# Patient Record
Sex: Female | Born: 1985 | Race: White | Hispanic: No | Marital: Married | Smoking: Former smoker
Health system: Southern US, Community
[De-identification: ages and names within clinical notes are randomized; demographics above are authoritative.]

## PROBLEM LIST (undated history)

## (undated) ENCOUNTER — Inpatient Hospital Stay: Payer: Self-pay

## (undated) DIAGNOSIS — F32A Depression, unspecified: Secondary | ICD-10-CM

## (undated) DIAGNOSIS — F419 Anxiety disorder, unspecified: Secondary | ICD-10-CM

## (undated) DIAGNOSIS — R87629 Unspecified abnormal cytological findings in specimens from vagina: Secondary | ICD-10-CM

## (undated) HISTORY — PX: TOOTH EXTRACTION: SUR596

## (undated) HISTORY — DX: Depression, unspecified: F32.A

## (undated) HISTORY — DX: Anxiety disorder, unspecified: F41.9

---
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
## 2007-08-04 ENCOUNTER — Emergency Department: Payer: Self-pay | Admitting: Emergency Medicine

## 2008-08-12 ENCOUNTER — Encounter: Payer: Self-pay | Admitting: Maternal & Fetal Medicine

## 2008-09-29 ENCOUNTER — Inpatient Hospital Stay: Payer: Self-pay | Admitting: Obstetrics and Gynecology

## 2010-06-06 ENCOUNTER — Emergency Department: Payer: Self-pay | Admitting: Emergency Medicine

## 2011-08-28 ENCOUNTER — Emergency Department: Payer: Self-pay | Admitting: Emergency Medicine

## 2011-09-28 ENCOUNTER — Emergency Department: Payer: Self-pay | Admitting: *Deleted

## 2011-09-28 LAB — URINALYSIS, COMPLETE
Bacteria: NONE SEEN
Bilirubin,UR: NEGATIVE
Blood: NEGATIVE
Nitrite: NEGATIVE
Protein: NEGATIVE
RBC,UR: NONE SEEN /HPF (ref 0–5)
Specific Gravity: 1.009 (ref 1.003–1.030)
Squamous Epithelial: 2
WBC UR: 5 /HPF (ref 0–5)

## 2011-09-29 LAB — PREGNANCY, URINE: Pregnancy Test, Urine: NEGATIVE m[IU]/mL

## 2011-11-03 ENCOUNTER — Emergency Department: Payer: Self-pay | Admitting: Emergency Medicine

## 2011-11-03 LAB — URINALYSIS, COMPLETE
Bilirubin,UR: NEGATIVE
Glucose,UR: NEGATIVE mg/dL (ref 0–75)
Ketone: NEGATIVE
Nitrite: NEGATIVE
Ph: 7 (ref 4.5–8.0)
Protein: 100
RBC,UR: 39 /HPF (ref 0–5)
Squamous Epithelial: 7
Transitional Epi: 1
WBC UR: 183 /HPF (ref 0–5)

## 2011-11-03 LAB — BASIC METABOLIC PANEL
Anion Gap: 9 (ref 7–16)
BUN: 14 mg/dL (ref 7–18)
Chloride: 111 mmol/L — ABNORMAL HIGH (ref 98–107)
EGFR (African American): >60
Glucose: 106 mg/dL — ABNORMAL HIGH (ref 65–99)
Osmolality: 288 (ref 275–301)
Potassium: 4.2 mmol/L (ref 3.5–5.1)
Sodium: 144 mmol/L (ref 136–145)

## 2011-11-03 LAB — CBC
MCH: 30.2 pg (ref 26.0–34.0)
MCHC: 34.4 g/dL (ref 32.0–36.0)
MCV: 88 fL (ref 80–100)
Platelet: 157 10*3/uL (ref 150–440)
RBC: 4.35 10*6/uL (ref 3.80–5.20)
WBC: 8.3 10*3/uL (ref 3.6–11.0)

## 2011-11-03 LAB — PREGNANCY, URINE: Pregnancy Test, Urine: NEGATIVE m[IU]/mL

## 2011-11-03 LAB — WET PREP, GENITAL

## 2011-12-08 ENCOUNTER — Emergency Department: Payer: Self-pay | Admitting: *Deleted

## 2011-12-08 LAB — CBC WITH DIFFERENTIAL/PLATELET
Basophil #: 0 10*3/uL (ref 0.0–0.1)
Eosinophil #: 0 10*3/uL (ref 0.0–0.7)
Eosinophil %: 0.1 %
HCT: 36.5 % (ref 35.0–47.0)
MCH: 30.4 pg (ref 26.0–34.0)
MCHC: 35.3 g/dL (ref 32.0–36.0)
MCV: 86 fL (ref 80–100)
Monocyte #: 1.4 x10 3/mm — ABNORMAL HIGH (ref 0.2–0.9)
Neutrophil #: 12.9 10*3/uL — ABNORMAL HIGH (ref 1.4–6.5)
Neutrophil %: 83.2 %
Platelet: 134 10*3/uL — ABNORMAL LOW (ref 150–440)
RDW: 12.9 % (ref 11.5–14.5)

## 2011-12-08 LAB — URINALYSIS, COMPLETE
Blood: NEGATIVE
Glucose,UR: NEGATIVE mg/dL (ref 0–75)
Ketone: NEGATIVE
Nitrite: POSITIVE
Ph: 7 (ref 4.5–8.0)
Protein: 30

## 2011-12-08 LAB — BASIC METABOLIC PANEL
BUN: 9 mg/dL (ref 7–18)
Co2: 27 mmol/L (ref 21–32)
EGFR (Non-African Amer.): >60
Glucose: 107 mg/dL — ABNORMAL HIGH (ref 65–99)
Potassium: 3 mmol/L — ABNORMAL LOW (ref 3.5–5.1)
Sodium: 139 mmol/L (ref 136–145)

## 2011-12-08 LAB — PREGNANCY, URINE: Pregnancy Test, Urine: NEGATIVE m[IU]/mL

## 2012-02-13 ENCOUNTER — Emergency Department: Payer: Self-pay | Admitting: Emergency Medicine

## 2012-02-13 LAB — CBC
HCT: 40.7 % (ref 35.0–47.0)
HGB: 13.7 g/dL (ref 12.0–16.0)
MCHC: 33.6 g/dL (ref 32.0–36.0)
Platelet: 176 10*3/uL (ref 150–440)
WBC: 15.6 10*3/uL — ABNORMAL HIGH (ref 3.6–11.0)

## 2012-02-13 LAB — URINALYSIS, COMPLETE
Ketone: NEGATIVE
Nitrite: NEGATIVE
Protein: 30
Specific Gravity: 1.018 (ref 1.003–1.030)
Squamous Epithelial: 8
WBC UR: 19 /HPF (ref 0–5)

## 2012-02-13 LAB — COMPREHENSIVE METABOLIC PANEL
Albumin: 4.6 g/dL (ref 3.4–5.0)
Alkaline Phosphatase: 68 U/L (ref 50–136)
Anion Gap: 8 (ref 7–16)
BUN: 8 mg/dL (ref 7–18)
Calcium, Total: 9.4 mg/dL (ref 8.5–10.1)
Creatinine: 0.75 mg/dL (ref 0.60–1.30)
Glucose: 91 mg/dL (ref 65–99)
Osmolality: 270 (ref 275–301)
Potassium: 4 mmol/L (ref 3.5–5.1)
SGOT(AST): 21 U/L (ref 15–37)
Sodium: 136 mmol/L (ref 136–145)
Total Protein: 8.7 g/dL — ABNORMAL HIGH (ref 6.4–8.2)

## 2012-02-13 LAB — LIPASE, BLOOD: Lipase: 98 U/L (ref 73–393)

## 2012-02-13 LAB — PREGNANCY, URINE: Pregnancy Test, Urine: NEGATIVE m[IU]/mL

## 2012-04-04 ENCOUNTER — Inpatient Hospital Stay: Payer: Self-pay | Admitting: Psychiatry

## 2012-04-04 LAB — URINALYSIS, COMPLETE
Bilirubin,UR: NEGATIVE
Blood: NEGATIVE
Ph: 7 (ref 4.5–8.0)
Protein: NEGATIVE
RBC,UR: 1 /HPF (ref 0–5)
Squamous Epithelial: 19

## 2012-04-04 LAB — DRUG SCREEN, URINE
Barbiturates, Ur Screen: NEGATIVE (ref ?–200)
Benzodiazepine, Ur Scrn: NEGATIVE (ref ?–200)
Cocaine Metabolite,Ur ~~LOC~~: NEGATIVE (ref ?–300)
Methadone, Ur Screen: NEGATIVE (ref ?–300)
Opiate, Ur Screen: NEGATIVE (ref ?–300)
Phencyclidine (PCP) Ur S: NEGATIVE (ref ?–25)
Tricyclic, Ur Screen: NEGATIVE (ref ?–1000)

## 2012-04-04 LAB — ETHANOL
Ethanol %: <0.003 % (ref 0.000–0.080)
Ethanol: <3 mg/dL

## 2012-04-04 LAB — CBC
HCT: 37.8 % (ref 35.0–47.0)
HGB: 13.1 g/dL (ref 12.0–16.0)
MCH: 31 pg (ref 26.0–34.0)
MCHC: 34.8 g/dL (ref 32.0–36.0)
RBC: 4.25 10*6/uL (ref 3.80–5.20)

## 2012-04-04 LAB — ACETAMINOPHEN LEVEL: Acetaminophen: <2 ug/mL

## 2012-04-04 LAB — COMPREHENSIVE METABOLIC PANEL
Albumin: 4.2 g/dL (ref 3.4–5.0)
Anion Gap: 6 — ABNORMAL LOW (ref 7–16)
Calcium, Total: 8.9 mg/dL (ref 8.5–10.1)
Creatinine: 0.66 mg/dL (ref 0.60–1.30)
Glucose: 90 mg/dL (ref 65–99)
Potassium: 4.4 mmol/L (ref 3.5–5.1)
SGOT(AST): 18 U/L (ref 15–37)

## 2012-04-04 LAB — SALICYLATE LEVEL: Salicylates, Serum: <1.7 mg/dL

## 2012-04-04 LAB — TSH: Thyroid Stimulating Horm: 0.67 u[IU]/mL

## 2012-04-04 LAB — PREGNANCY, URINE: Pregnancy Test, Urine: NEGATIVE m[IU]/mL

## 2012-08-16 ENCOUNTER — Emergency Department: Payer: Self-pay | Admitting: Unknown Physician Specialty

## 2012-08-16 LAB — CBC WITH DIFFERENTIAL/PLATELET
Basophil #: 0 10*3/uL (ref 0.0–0.1)
Eosinophil %: 0.1 %
Lymphocyte #: 0.8 10*3/uL — ABNORMAL LOW (ref 1.0–3.6)
Lymphocyte %: 5.4 %
MCH: 30.4 pg (ref 26.0–34.0)
MCV: 88 fL (ref 80–100)
Monocyte #: 1 x10 3/mm — ABNORMAL HIGH (ref 0.2–0.9)
Monocyte %: 6.8 %
Neutrophil #: 13.3 10*3/uL — ABNORMAL HIGH (ref 1.4–6.5)
Neutrophil %: 87.6 %
RBC: 4.47 10*6/uL (ref 3.80–5.20)
RDW: 12.4 % (ref 11.5–14.5)
WBC: 15.2 10*3/uL — ABNORMAL HIGH (ref 3.6–11.0)

## 2012-08-16 LAB — COMPREHENSIVE METABOLIC PANEL
Alkaline Phosphatase: 57 U/L (ref 50–136)
Anion Gap: 6 — ABNORMAL LOW (ref 7–16)
BUN: 13 mg/dL (ref 7–18)
Calcium, Total: 8.8 mg/dL (ref 8.5–10.1)
Chloride: 106 mmol/L (ref 98–107)
Creatinine: 0.93 mg/dL (ref 0.60–1.30)
EGFR (African American): >60
EGFR (Non-African Amer.): >60
SGOT(AST): 15 U/L (ref 15–37)
SGPT (ALT): 13 U/L (ref 12–78)
Total Protein: 7.3 g/dL (ref 6.4–8.2)

## 2012-08-16 LAB — LIPASE, BLOOD: Lipase: 114 U/L (ref 73–393)

## 2012-08-16 LAB — RAPID INFLUENZA A&B ANTIGENS

## 2012-10-07 IMAGING — CT CT ABD-PELV W/ CM
1 of 2 series · 14 of 32 positions shown, 18 images · non-contrast
Comparison: none

REASON FOR EXAM: (1) FEVER, NAUSEA; (2) RLQ PAIN, EVAL APPENDIX;    NOTE:
Nursing to Give Oral CT
COMMENTS:   May transport without cardiac monitor

[Series 2: 3mm soft tissue · axial · 0.66mm/px · z∈[-957,-540]mm · 14 of 153 slices shown, 18 images]
[im 7/153  soft-tissue]
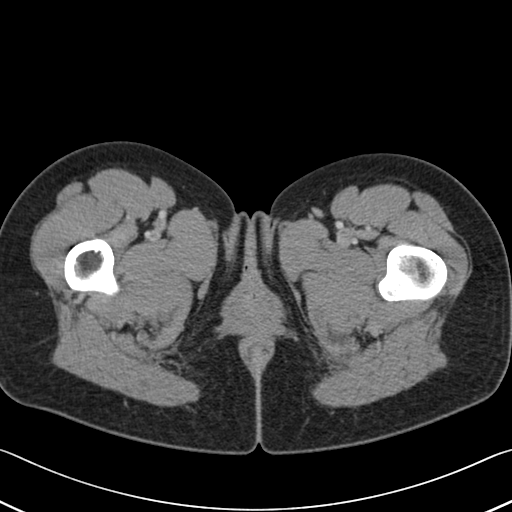
[im 7/153  bone]
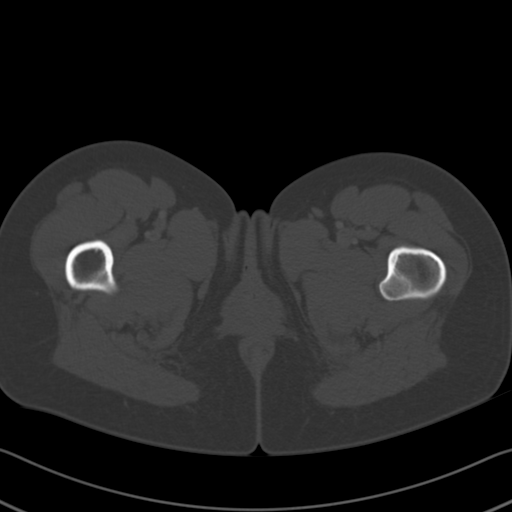
[im 20/153  soft-tissue]
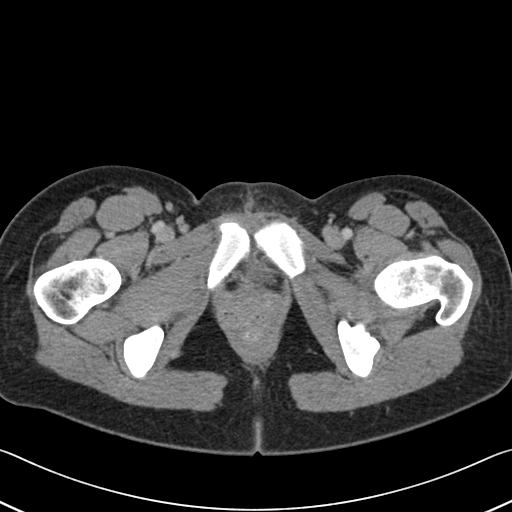
[im 32/153  soft-tissue]
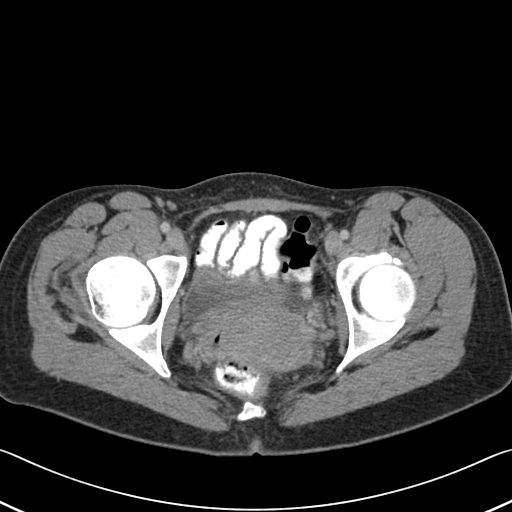
[im 45/153  soft-tissue]
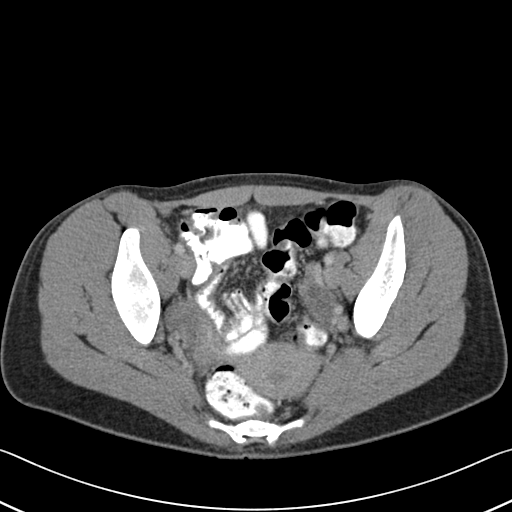
[im 58/153  soft-tissue]
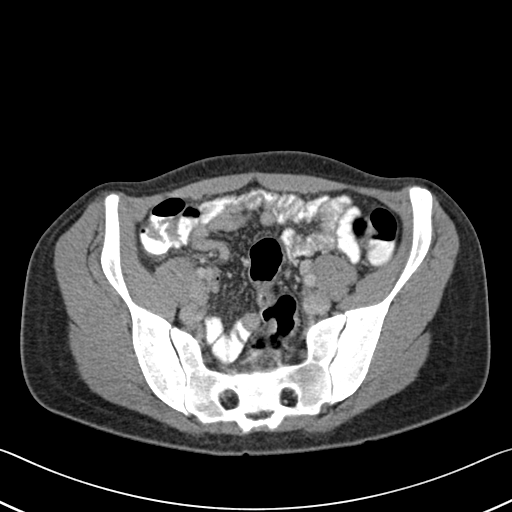
[im 70/153  soft-tissue]
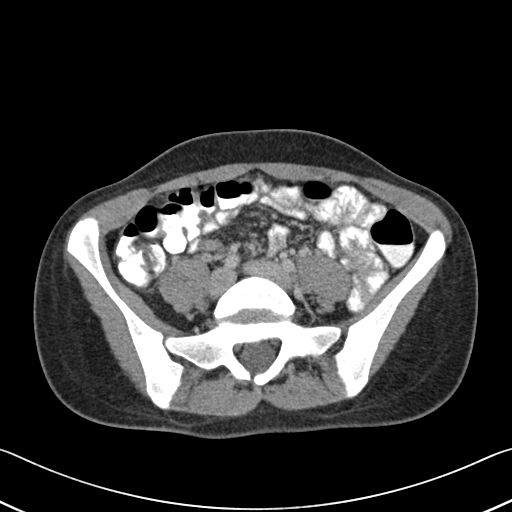
[im 83/153  soft-tissue]
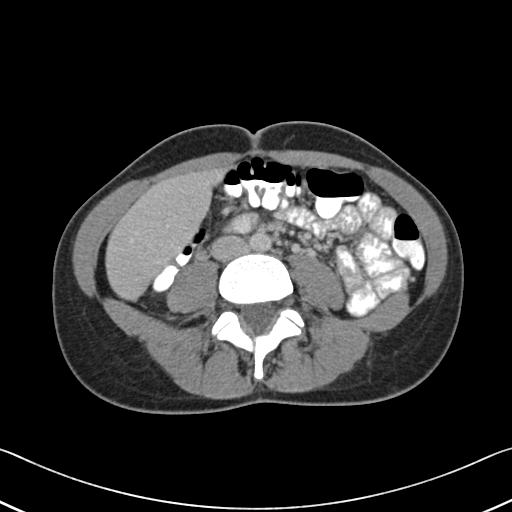
[im 96/153  soft-tissue]
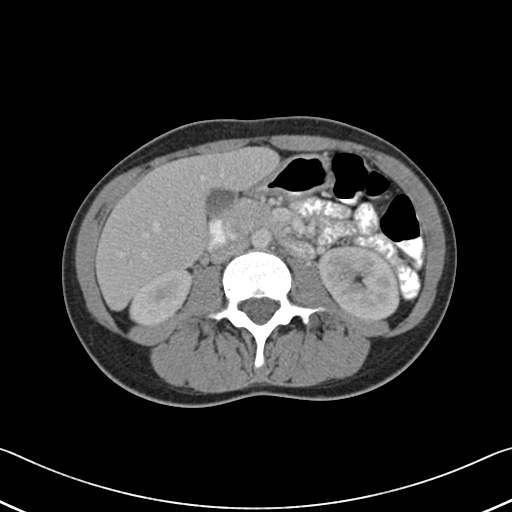
[im 108/153  soft-tissue]
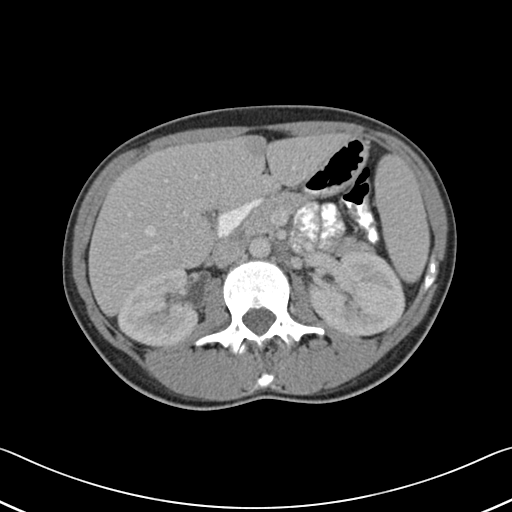
[im 108/153  bone]
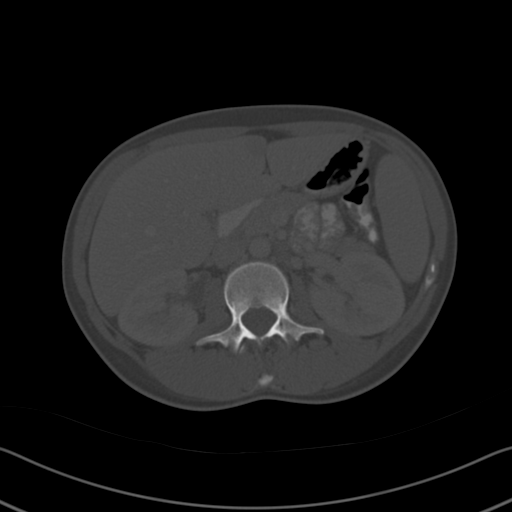
[im 121/153  soft-tissue]
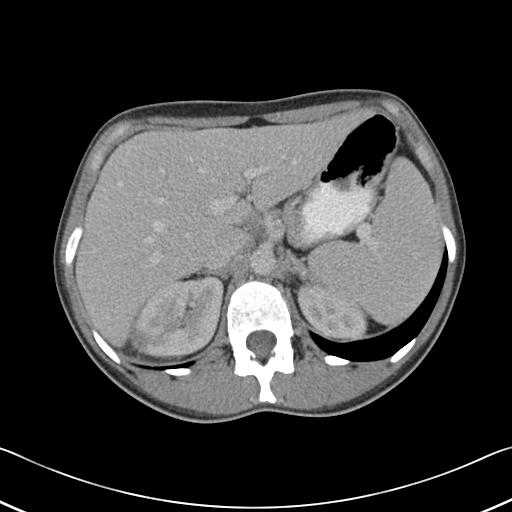
[im 127/153  lung]
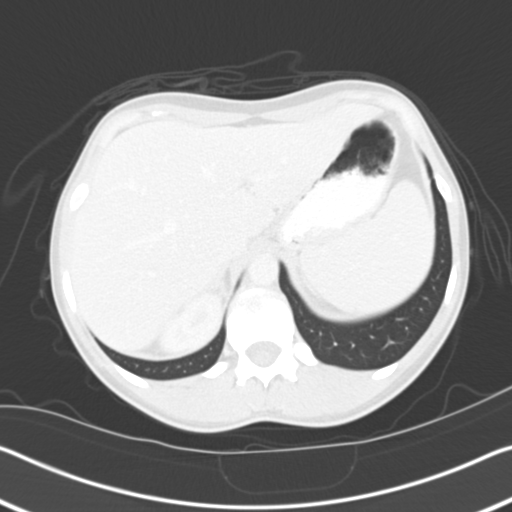
[im 134/153  soft-tissue]
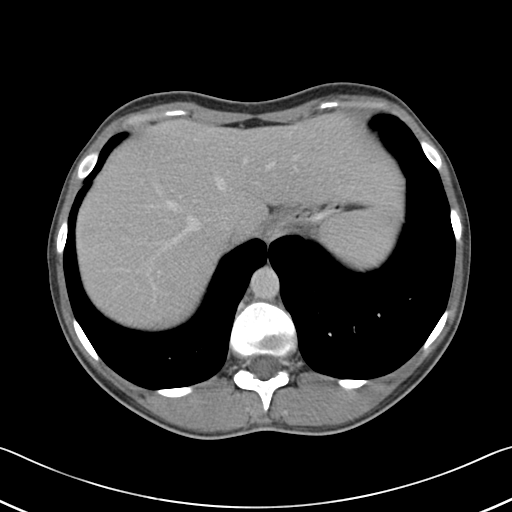
[im 134/153  lung]
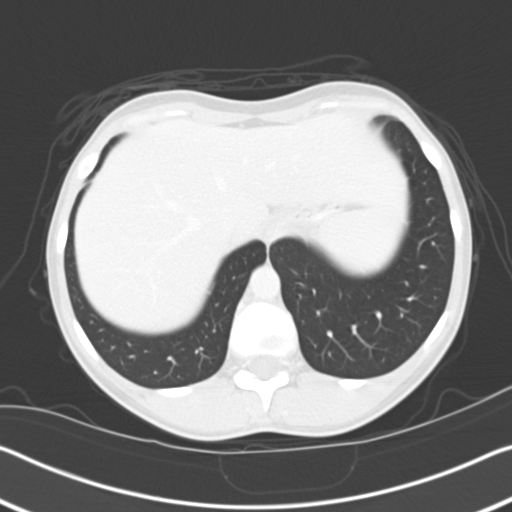
[im 140/153  lung]
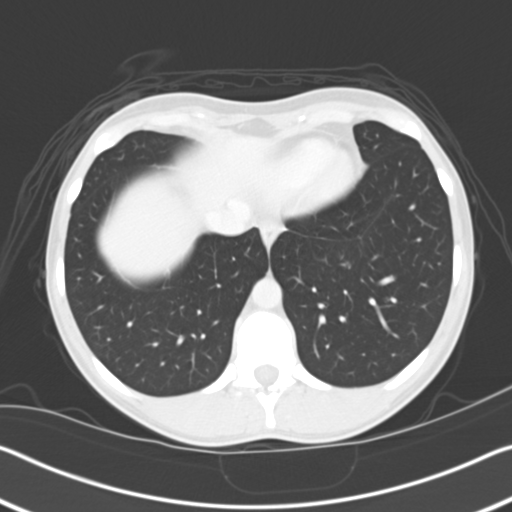
[im 146/153  soft-tissue]
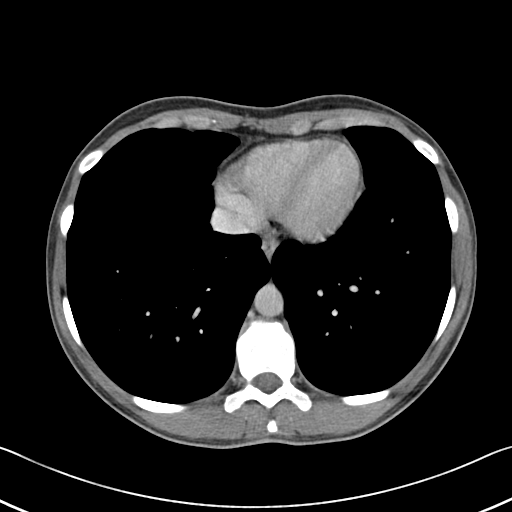
[im 146/153  lung]
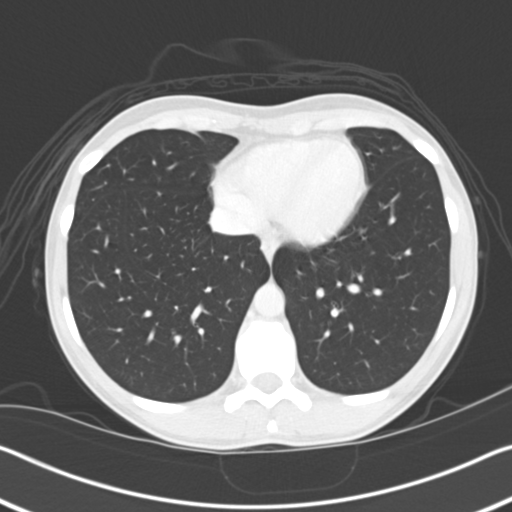

[14 of 32 positions shown; findings below may reference images not displayed]

PROCEDURE:     CT  - CT ABDOMEN / PELVIS  W  - December 08, 2011 [DATE]

RESULT:     Axial CT scanning was performed through the abdomen and pelvis
with reconstructions at 3 mm intervals and slice thicknesses. Review of
multiplanar reconstructed images was performed separately on the VIA
monitor. The patient received 75 cc of Dsovue-LPP and also received oral
contrast material.

In the right lower quadrant of the abdomen is no evidence of an inflammatory
process. A normal calibered partially contrast and gas filled appendix is
demonstrated. There is no evidence of an umbilical nor inguinal hernia.
There is no small or large bowel obstruction. Is soft tissue fullness in the
right adnexal region which likely reflects a cystic ovarian process. Similar
but smaller findings are noted on the left. The uterus is retroverted
exhibits no acute abnormality. The partially distended urinary bladder is
normal in appearance.

The liver exhibits no focal mass or ductal dilation. The gallbladder is
adequately distended with no evidence of calcified stones. The spleen,
partially distended stomach, pancreas, and adrenal glands are normal in
appearance. The caliber of the abdominal aorta is normal. In the lateral
aspect of the mid pole of the right kidney there is an 1.5 cm focus of
decreased density that is wedge-shaped and could reflect an infarct. It
exhibits Hounsfield measurement of +41 with the adjacent parenchyma
exhibiting Hounsfield measurement of +113. There is no hydronephrosis nor
calcified stones.

The lung bases are clear. The lumbar vertebral bodies are preserved in
height.
IMPRESSION: 1. There is no evidence of an acute bowel abnormality. A normal appearing
appendix is demonstrated.
2. There is soft tissue fullness in the adnexal regions most compatible with
cystic ovarian processes. Pelvic ultrasound in October 2011 demonstrated
complex cystic structures associated with the right ovary.
3. There is a wedge-shaped hypodensity measuring 1.5 cm in greatest
dimension in the lateral aspect of the midpole cortex of the right kidney.
This is nonspecific but could reflect an infarct in the appropriate clinical
setting. Correlation with presence of any hematuria would be useful. There
is no evidence of urinary tract obstruction.
4. There is no evidence of acute hepatobiliary abnormality.

[REDACTED]

## 2012-10-16 ENCOUNTER — Emergency Department: Payer: Self-pay | Admitting: Emergency Medicine

## 2013-06-01 ENCOUNTER — Ambulatory Visit: Payer: Self-pay | Admitting: General Surgery

## 2013-06-25 ENCOUNTER — Encounter: Payer: Self-pay | Admitting: *Deleted

## 2013-10-27 ENCOUNTER — Ambulatory Visit (INDEPENDENT_AMBULATORY_CARE_PROVIDER_SITE_OTHER): Payer: Medicaid Other | Admitting: General Surgery

## 2013-10-27 ENCOUNTER — Other Ambulatory Visit: Payer: Medicaid Other

## 2013-10-27 ENCOUNTER — Encounter: Payer: Self-pay | Admitting: General Surgery

## 2013-10-27 VITALS — BP 120/66 | HR 76 | Resp 14 | Ht 67.0 in | Wt 131.0 lb

## 2013-10-27 DIAGNOSIS — N63 Unspecified lump in unspecified breast: Secondary | ICD-10-CM

## 2013-10-27 NOTE — Progress Notes (Signed)
Patient ID: Margaret [name], female   DOB: March 14, 1986, [age] y.o.   MRN: [id number]  Chief Complaint  Patient presents with  . Other    right breast mass    HPI Margaret [name] is a 28 y.o. female here today for an evaluation of right breast lump and nipple discharge. Patient noticed a knot in the right breast for about 2 months. She states it will get larger but then it reduces in size. This seems to correlate with her menstrual cycle, the most prominent just prior to the menses. She is presently midcycle. Tenderness in right breast, denies breast trauma.  States she still has some milk production from both breast since her last child 5 years ago (she did not breast feed). She notices this only with manipulation.  No family history of breast cancer.  HPI  No past medical history on file.  No past surgical history on file.  No family history on file.  Social History History  Substance Use Topics  . Smoking status: Current Every Day Smoker -- 1.00 packs/day for 10 years    Types: Cigarettes  . Smokeless tobacco: Never Used  . Alcohol Use: No    No Known Allergies  No current outpatient prescriptions on file.   No current facility-administered medications for this visit.    Review of Systems Review of Systems  Constitutional: Negative.   Respiratory: Negative.   Cardiovascular: Negative.     Blood pressure 120/66, pulse 76, resp. rate 14, height 5\' 7"  (1.702 m), weight 131 lb (59.421 kg), last menstrual period 10/13/2013.  Physical Exam Physical Exam  Constitutional: She is oriented to person, place, and time. She appears well-developed and well-nourished.  Neck: Neck supple.  Cardiovascular: Normal rate, regular rhythm and normal heart sounds.   Pulmonary/Chest: Effort normal and breath sounds normal. Right breast exhibits mass. Right breast exhibits no inverted nipple, no nipple discharge, no skin change and no tenderness. Left breast exhibits no inverted nipple, no mass,  no nipple discharge, no skin change and no tenderness.    Soft fullness at 12 o'clock right breast   Lymphadenopathy:    She has no cervical adenopathy.    She has no axillary adenopathy.  Small 5 mm normal lymph nodes are noted in the supraclavicular area and axillary areas bilaterally  Neurological: She is alert and oriented to person, place, and time.  Skin: Skin is warm and dry.    Data Reviewed Ultrasound examination of the right breast with special attention to the upper outer quadrant was completed. In the 11 o'clock position there is a irregular hypoechoic area measuring 0.4 x 1.15 x 1.5 cm. This correlates with the area of prominence on clinical exam, 6 cm from the nipple. No clearly identifiable cystic lesions are appreciated. Brief examination of the retroareolar area shows no ductal dilatation. BI-RAD-4.  Assessment    This focal asymmetry may be a cluster of cysts or less likely a fibroadenoma based on the clinical reports a variation during her menstrual cycle. It certainly does not represent a simple cyst.     Plan    Considering her young age and family responsibilities (2 boys age 145 and 57) I think it reasonable to proceed to a vacuum biopsy. Due to scheduling was not possible to complete this today, but this will be scheduled convenient date.  She was encouraged to consider smoking cessation.  The small amount of milky nipple drainage with manipulation, unchanged over years does not likely represent elevated serum prolactin  levels and can be observed.     Arrange for right breast biopsy.   PCP: Evelene CroonNiemeyer, Meindert   Ref: Lyndel PleasureErica Howard NP   Earline MayotteByrnett, Jeffrey W 10/27/2013, 8:47 PM

## 2013-10-27 NOTE — Patient Instructions (Signed)
Continue self breast exams. Call office for any new breast issues or concerns. 

## 2013-11-12 ENCOUNTER — Encounter: Payer: Self-pay | Admitting: General Surgery

## 2013-11-12 ENCOUNTER — Other Ambulatory Visit: Payer: Medicaid Other

## 2013-11-12 ENCOUNTER — Ambulatory Visit (INDEPENDENT_AMBULATORY_CARE_PROVIDER_SITE_OTHER): Payer: Medicaid Other | Admitting: General Surgery

## 2013-11-12 VITALS — BP 96/56 | HR 70 | Resp 12 | Ht 67.0 in | Wt 133.0 lb

## 2013-11-12 DIAGNOSIS — N63 Unspecified lump in unspecified breast: Secondary | ICD-10-CM

## 2013-11-12 NOTE — Progress Notes (Signed)
Patient ID: Margaret BasemanHeather M [name], female   DOB: 08/06/1985, 28 y.o.   MRN: [id number]  Chief Complaint  Patient presents with  . Procedure    right breast biopsy    HPI Margaret BasemanHeather M [name] is a 28 y.o. female.  Here today for a right breast biopsy. HPI  No past medical history on file.  No past surgical history on file.  No family history on file.  Social History History  Substance Use Topics  . Smoking status: Former Smoker -- 1.00 packs/day for 10 years    Types: Cigarettes    Quit date: 11/08/2013  . Smokeless tobacco: Never Used  . Alcohol Use: No    No Known Allergies  No current outpatient prescriptions on file.   No current facility-administered medications for this visit.    Review of Systems Review of Systems  Constitutional: Negative.   Respiratory: Negative.   Cardiovascular: Negative.     Blood pressure 96/56, pulse 70, resp. rate 12, height 5\' 7"  (1.702 m), weight 133 lb (60.328 kg), last menstrual period 10/12/2013.  Physical Exam Physical Exam Focal thickening upper outer quadrant.  Data Reviewed Previous ultrasound showed a hypoechoic mass in the 11 o'clock position.  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     Plan    The patient was amenable to proceed with biopsy. After alcohol prep 10 cc of 0.5% Xylocaine with 0.25% Marcaine with 1-200,000 units of epinephrine was utilized well-tolerated. Chlor prep was applied to the skin. Using a 10-gauge Encor device set to a 12 mm sample depth the area was completely excised. Scant bleeding was noted. A postbiopsy clip was placed. Skin defect was closed with benzoin and Steri-Strips followed by Telfa Tegaderm dressing.  She was provided with written instructions in regards to wound care.  We'll plan for followup with nurse in one week.      PCP: Jake SharkNiemeyer, Meindert   Naji Mehringer W 11/12/2013, 8:38 PM

## 2013-11-12 NOTE — Patient Instructions (Signed)

## 2013-11-16 ENCOUNTER — Telehealth: Payer: Self-pay

## 2013-11-16 LAB — PATHOLOGY

## 2013-11-16 NOTE — Telephone Encounter (Signed)
Message left for patient to call back for biopsy results.

## 2013-11-17 NOTE — Telephone Encounter (Signed)
Notified patient that her biopsy was benign, patient pleased. Discussed follow-up appointment scheduled for 11/20/13, patient agrees.

## 2013-11-20 ENCOUNTER — Ambulatory Visit: Payer: Medicaid Other

## 2014-02-24 ENCOUNTER — Ambulatory Visit: Payer: Medicaid Other | Admitting: General Surgery

## 2014-03-15 ENCOUNTER — Encounter: Payer: Self-pay | Admitting: General Surgery

## 2014-05-19 ENCOUNTER — Ambulatory Visit: Payer: Self-pay | Admitting: General Surgery

## 2014-05-20 ENCOUNTER — Encounter: Payer: Self-pay | Admitting: *Deleted
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
## 2014-08-31 NOTE — Consult Note (Signed)
Brief Consult Note: Diagnosis: Major depressive episode.   Patient was seen by consultant.   Recommend further assessment or treatment.   Orders entered.   Comments: Ms. Given has been increasingly depressed in the past 3 months. She is suicidal; with a plan to hang herself.   PLAN: 1. Will admitt to BMU.   2. Start Prozac 20 mg fort depression and Ambien 10 mg for sleep.  Electronic Signatures: Kristine LineaPucilowska, Lakyra Tippins (MD)  (Signed [phone]-Nov-13 15:53)  Authored: Brief Consult Note   Last Updated: 22-Nov-13 15:53 by Kristine LineaPucilowska, Sarah Zerby (MD)

## 2014-12-06 ENCOUNTER — Encounter: Payer: Self-pay | Admitting: Emergency Medicine

## 2014-12-06 ENCOUNTER — Emergency Department: Payer: Self-pay

## 2014-12-06 ENCOUNTER — Emergency Department
Admission: EM | Admit: 2014-12-06 | Discharge: 2014-12-06 | Disposition: A | Discharge status: Home / Self Care | Payer: Self-pay | Attending: Emergency Medicine | Admitting: Emergency Medicine

## 2014-12-06 DIAGNOSIS — Z720 Tobacco use: Secondary | ICD-10-CM | POA: Insufficient documentation

## 2014-12-06 DIAGNOSIS — Y9389 Activity, other specified: Secondary | ICD-10-CM | POA: Insufficient documentation

## 2014-12-06 DIAGNOSIS — S40011A Contusion of right shoulder, initial encounter: Secondary | ICD-10-CM | POA: Insufficient documentation

## 2014-12-06 DIAGNOSIS — W1839XA Other fall on same level, initial encounter: Secondary | ICD-10-CM | POA: Insufficient documentation

## 2014-12-06 DIAGNOSIS — S46911A Strain of unspecified muscle, fascia and tendon at shoulder and upper arm level, right arm, initial encounter: Secondary | ICD-10-CM | POA: Insufficient documentation

## 2014-12-06 DIAGNOSIS — Y998 Other external cause status: Secondary | ICD-10-CM | POA: Insufficient documentation

## 2014-12-06 DIAGNOSIS — S40021A Contusion of right upper arm, initial encounter: Secondary | ICD-10-CM

## 2014-12-06 DIAGNOSIS — Y9289 Other specified places as the place of occurrence of the external cause: Secondary | ICD-10-CM | POA: Insufficient documentation

## 2014-12-06 DIAGNOSIS — IMO0001 Reserved for inherently not codable concepts without codable children: Secondary | ICD-10-CM

## 2014-12-06 MED ORDER — NAPROXEN 500 MG PO TABS: 500.0000 mg | ORAL_TABLET | Freq: Two times a day (BID) | ORAL | Status: DC

## 2014-12-06 NOTE — ED Notes (Signed)
Pt fell about two weeks ago and now having right shoulder pain.
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
## 2015-07-11 ENCOUNTER — Emergency Department
Admission: EM | Admit: 2015-07-11 | Discharge: 2015-07-11 | Disposition: A | Discharge status: Home / Self Care | Payer: Self-pay | Attending: Emergency Medicine | Admitting: Emergency Medicine

## 2015-07-11 ENCOUNTER — Encounter: Payer: Self-pay | Admitting: Emergency Medicine

## 2015-07-11 DIAGNOSIS — Z3202 Encounter for pregnancy test, result negative: Secondary | ICD-10-CM | POA: Insufficient documentation

## 2015-07-11 DIAGNOSIS — R1084 Generalized abdominal pain: Secondary | ICD-10-CM | POA: Insufficient documentation

## 2015-07-11 DIAGNOSIS — R112 Nausea with vomiting, unspecified: Secondary | ICD-10-CM | POA: Insufficient documentation

## 2015-07-11 DIAGNOSIS — F17210 Nicotine dependence, cigarettes, uncomplicated: Secondary | ICD-10-CM | POA: Insufficient documentation

## 2015-07-11 DIAGNOSIS — Z791 Long term (current) use of non-steroidal anti-inflammatories (NSAID): Secondary | ICD-10-CM | POA: Insufficient documentation

## 2015-07-11 DIAGNOSIS — R51 Headache: Secondary | ICD-10-CM | POA: Insufficient documentation

## 2015-07-11 DIAGNOSIS — R519 Headache, unspecified: Secondary | ICD-10-CM

## 2015-07-11 LAB — COMPREHENSIVE METABOLIC PANEL
ALBUMIN: 4.5 g/dL (ref 3.5–5.0)
ALT: 13 U/L — ABNORMAL LOW (ref 14–54)
ANION GAP: 7 (ref 5–15)
AST: 15 U/L (ref 15–41)
Alkaline Phosphatase: 32 U/L — ABNORMAL LOW (ref 38–126)
BILIRUBIN TOTAL: 0.6 mg/dL (ref 0.3–1.2)
BUN: 17 mg/dL (ref 6–20)
CHLORIDE: 112 mmol/L — AB (ref 101–111)
CO2: 22 mmol/L (ref 22–32)
CREATININE: 0.65 mg/dL (ref 0.44–1.00)
Calcium: 8.8 mg/dL — ABNORMAL LOW (ref 8.9–10.3)
GFR calc Af Amer: >60 mL/min (ref >60)
Glucose, Bld: 101 mg/dL — ABNORMAL HIGH (ref 65–99)
Potassium: 3.7 mmol/L (ref 3.5–5.1)
Sodium: 141 mmol/L (ref 135–145)
Total Protein: 7.2 g/dL (ref 6.5–8.1)

## 2015-07-11 LAB — URINALYSIS COMPLETE WITH MICROSCOPIC (ARMC ONLY)
Bilirubin Urine: NEGATIVE
Glucose, UA: NEGATIVE mg/dL
Ketones, ur: NEGATIVE mg/dL
Nitrite: NEGATIVE
PH: 5 (ref 5.0–8.0)
PROTEIN: 30 mg/dL — AB
Specific Gravity, Urine: 1.03 (ref 1.005–1.030)

## 2015-07-11 LAB — CBC
HCT: 40.7 % (ref 35.0–47.0)
HEMOGLOBIN: 13.7 g/dL (ref 12.0–16.0)
MCH: 30.6 pg (ref 26.0–34.0)
MCHC: 33.6 g/dL (ref 32.0–36.0)
MCV: 91.3 fL (ref 80.0–100.0)
PLATELETS: 164 10*3/uL (ref 150–440)
RBC: 4.46 MIL/uL (ref 3.80–5.20)
RDW: 12.6 % (ref 11.5–14.5)
WBC: 12.9 10*3/uL — ABNORMAL HIGH (ref 3.6–11.0)

## 2015-07-11 LAB — LIPASE, BLOOD: Lipase: 32 U/L (ref 11–51)

## 2015-07-11 LAB — POCT PREGNANCY, URINE: Preg Test, Ur: NEGATIVE

## 2015-07-11 MED ORDER — KETOROLAC TROMETHAMINE 30 MG/ML IJ SOLN
30.0000 mg | Freq: Once | INTRAMUSCULAR | Status: AC
  Administered 2015-07-11: 30 mg via INTRAMUSCULAR
  Filled 2015-07-11: qty 1

## 2015-07-11 MED ORDER — ONDANSETRON 8 MG PO TBDP
8.0000 mg | ORAL_TABLET | Freq: Once | ORAL | Status: AC
  Administered 2015-07-11: 8 mg via ORAL
  Filled 2015-07-11: qty 1

## 2015-07-11 MED ORDER — METOCLOPRAMIDE HCL 10 MG PO TABS
10.0000 mg | ORAL_TABLET | Freq: Once | ORAL | Status: AC
  Administered 2015-07-11: 10 mg via ORAL
  Filled 2015-07-11: qty 1

## 2015-07-11 MED ORDER — METOCLOPRAMIDE HCL 10 MG PO TABS: 10.0000 mg | ORAL_TABLET | Freq: Three times a day (TID) | ORAL | Status: DC

## 2015-07-11 NOTE — ED Provider Notes (Signed)
[facility] Emergency Department Provider Note  ____________________________________________  Time seen: 9:25 AM  I have reviewed the triage vital signs and the nursing notes.   HISTORY  Chief Complaint Emesis    HPI Margaret [name] is a 30 y.o. female who complains of headache that started around 5:00 this morning. She got up and took an NSAID and went back to bed, and then woke up shortly thereafter with nausea and some vomiting. Has a hard time eating or drinking anything this morning. When she went to bed last night everything was totally normal and her usual state of health. No sick contacts, no unusual foods. Denies any chest pain or shortness of breath but does have some generalized abdominal pain with this. We'll bowel movement so far.  Has had a history of headaches in the past as well.     History reviewed. No pertinent past medical history.   Patient Active Problem List   Diagnosis Date Noted  . Lump or mass in breast 11/12/2013  . Breast mass 10/27/2013     History reviewed. No pertinent past surgical history.   Current Outpatient Rx  Name  Route  Sig  Dispense  Refill  . metoCLOPramide (REGLAN) 10 MG tablet   Oral   Take 1 tablet (10 mg total) by mouth 4 (four) times daily -  before meals and at bedtime.   60 tablet   0   . naproxen (NAPROSYN) 500 MG tablet   Oral   Take 1 tablet (500 mg total) by mouth 2 (two) times daily with a meal.   30 tablet   0      Allergies Review of patient's allergies indicates no known allergies.   No family history on file.  Social History Social History  Substance Use Topics  . Smoking status: Current Some Day Smoker -- 1.00 packs/day for 10 years    Types: Cigarettes    Last Attempt to Quit: 11/08/2013  . Smokeless tobacco: Never Used  . Alcohol Use: No    Review of Systems  Constitutional:   No fever or chills. No weight changes Eyes:   No blurry vision or double vision.   ENT:   No sore throat.  Cardiovascular:   No chest pain. Respiratory:   No dyspnea or cough. Gastrointestinal:   Positive generalized abdominal pain with vomiting.  No BRBPR or melena. Genitourinary:   Negative for dysuria or difficulty urinating. Musculoskeletal:   Negative for back pain. No joint swelling or pain. Skin:   Negative for rash. Neurological:   Positive headache bilaterally without focal weakness or numbness. Psychiatric:  No anxiety or depression.   Endocrine:  No changes in energy or sleep difficulty.  10-point ROS otherwise negative.  ____________________________________________   PHYSICAL EXAM:  VITAL SIGNS: ED Triage Vitals  Enc Vitals Group     BP 07/11/15 0753 117/62 mmHg     Pulse Rate 07/11/15 0753 80     Resp 07/11/15 0753 18     Temp 07/11/15 0753 97.6 F (36.4 C)     Temp Source 07/11/15 0753 Oral     SpO2 07/11/15 0753 100 %     Weight 07/11/15 0753 130 lb (58.968 kg)     Height 07/11/15 0753  (1.702 m)     Head Cir --      Peak Flow --      Pain Score 07/11/15 0753 8     Pain Loc --  Pain Edu? --      Excl. in GC? --     Vital signs reviewed, nursing assessments reviewed.   Constitutional:   Alert and oriented. Well appearing and in no distress. Eyes:   No scleral icterus. No conjunctival pallor. PERRL. EOMI ENT   Head:   Normocephalic and atraumatic.   Nose:   No congestion/rhinnorhea. No septal hematoma   Mouth/Throat:   MMM, mild pharyngeal erythema. No peritonsillar mass.    Neck:   No stridor. No SubQ emphysema. No meningismus. Hematological/Lymphatic/Immunilogical:   No cervical lymphadenopathy. Cardiovascular:   RRR. Symmetric bilateral radial and DP pulses.  No murmurs.  Respiratory:   Normal respiratory effort without tachypnea nor retractions. Breath sounds are clear and equal bilaterally. No wheezes/rales/rhonchi. Gastrointestinal:   Soft and nontender. Non distended. There is no CVA tenderness.  No  rebound, rigidity, or guarding. Genitourinary:   deferred Musculoskeletal:   Nontender with normal range of motion in all extremities. No joint effusions.  No lower extremity tenderness.  No edema. Neurologic:   Normal speech and language.  CN 2-10 normal. Motor grossly intact. No gross focal neurologic deficits are appreciated.  Skin:    Skin is warm, dry and intact. No rash noted.  No petechiae, purpura, or bullae. Psychiatric:   Mood and affect are normal. ____________________________________________    LABS (pertinent positives/negatives) (all labs ordered are listed, but only abnormal results are displayed) Labs Reviewed  COMPREHENSIVE METABOLIC PANEL - Abnormal; Notable for the following:    Chloride 112 (*)    Glucose, Bld 101 (*)    Calcium 8.8 (*)    ALT 13 (*)    Alkaline Phosphatase 32 (*)    All other components within normal limits  CBC - Abnormal; Notable for the following:    WBC 12.9 (*)    All other components within normal limits  URINALYSIS COMPLETEWITH MICROSCOPIC ([facility] ONLY) - Abnormal; Notable for the following:    Color, Urine YELLOW (*)    APPearance HAZY (*)    Hgb urine dipstick 3+ (*)    Protein, ur 30 (*)    Leukocytes, UA TRACE (*)    Bacteria, UA RARE (*)    Squamous Epithelial / LPF 6-30 (*)    All other components within normal limits  LIPASE, BLOOD  POC URINE PREG, ED  POCT PREGNANCY, URINE   ____________________________________________   EKG    ____________________________________________    RADIOLOGY    ____________________________________________   PROCEDURES   ____________________________________________   INITIAL IMPRESSION / ASSESSMENT AND PLAN / ED COURSE  Pertinent labs & imaging results that were available during my care of the patient were reviewed by me and considered in my medical decision making (see chart for details).  Patient presents with generalized abdominal pain nausea vomiting and headache. Low  suspicion for serious intracranial pathology such as meningitis encephalitis or intracranial hypertension glaucoma or venous thrombosis. Abdominal exam is unremarkable and reassuring. Considering the patient's symptoms, medical history, and physical examination today, I have low suspicion for cholecystitis or biliary pathology, pancreatitis, perforation or bowel obstruction, hernia, intra-abdominal abscess, AAA or dissection, volvulus or intussusception, mesenteric ischemia, or appendicitis. Low suspicion for torsion PID TOA  Labs negative including urinalysis and pregnancy test. We'll treat symptomatically, by mouth trial, plan for follow-up with primary care.  ----------------------------------------- 10:47 AM on 07/11/2015 -----------------------------------------  Patient remains stable, symptoms improved. Tolerating oral intake. Vital signs normal. We'll discharge home with symptom relief.     ____________________________________________   FINAL  CLINICAL IMPRESSION(S) / ED DIAGNOSES  Final diagnoses:  Generalized abdominal pain  Nonintractable headache, unspecified chronicity pattern, unspecified headache type  Non-intractable vomiting with nausea, vomiting of unspecified type      Sharman Cheek, MD 07/11/15 1047

## 2015-07-11 NOTE — Discharge Instructions (Signed)
Abdominal Pain, Adult Many things can cause abdominal pain. Usually, abdominal pain is not caused by a disease and will improve without treatment. It can often be observed and treated at home. Your health care provider will do a physical exam and possibly order blood tests and X-rays to help determine the seriousness of your pain. However, in many cases, more time must pass before a clear cause of the pain can be found. Before that point, your health care provider may not know if you need more testing or further treatment. HOME CARE INSTRUCTIONS Monitor your abdominal pain for any changes. The following actions may help to alleviate any discomfort you are experiencing:  Only take over-the-counter or prescription medicines as directed by your health care provider.  Do not take laxatives unless directed to do so by your health care provider.  Try a clear liquid diet (broth, tea, or water) as directed by your health care provider. Slowly move to a bland diet as tolerated. SEEK MEDICAL CARE IF:  You have unexplained abdominal pain.  You have abdominal pain associated with nausea or diarrhea.  You have pain when you urinate or have a bowel movement.  You experience abdominal pain that wakes you in the night.  You have abdominal pain that is worsened or improved by eating food.  You have abdominal pain that is worsened with eating fatty foods.  You have a fever. SEEK IMMEDIATE MEDICAL CARE IF:  Your pain does not go away within 2 hours.  You keep throwing up (vomiting).  Your pain is felt only in portions of the abdomen, such as the right side or the left lower portion of the abdomen.  You pass bloody or black tarry stools. MAKE SURE YOU:  Understand these instructions.  Will watch your condition.  Will get help right away if you are not doing well or get worse.   This information is not intended to replace advice given to you by your health care provider. Make sure you discuss  any questions you have with your health care provider.   Document Released: 02/07/2005 Document Revised: 01/19/2015 Document Reviewed: 01/07/2013 Elsevier Interactive Patient Education 2016 [place] Headache Without Cause A headache is pain or discomfort felt around the head or neck area. The specific cause of a headache may not be found. There are many causes and types of headaches. A few common ones are:  Tension headaches.  Migraine headaches.  Cluster headaches.  Chronic daily headaches. HOME CARE INSTRUCTIONS  Watch your condition for any changes. Take these steps to help with your condition: Managing Pain  Take over-the-counter and prescription medicines only as told by your health care provider.  Lie down in a dark, quiet room when you have a headache.  If directed, apply ice to the head and neck area:  Put ice in a plastic bag.  Place a towel between your skin and the bag.  Leave the ice on for 20 minutes, 2-3 times per day.  Use a heating pad or hot shower to apply heat to the head and neck area as told by your health care provider.  Keep lights dim if bright lights bother you or make your headaches worse. Eating and Drinking  Eat meals on a regular schedule.  Limit alcohol use.  Decrease the amount of caffeine you drink, or stop drinking caffeine. General Instructions  Keep all follow-up visits as told by your health care provider. This is important.  Keep a headache journal to help  find out what may trigger your headaches. For example, write down:  What you eat and drink.  How much sleep you get.  Any change to your diet or medicines.  Try massage or other relaxation techniques.  Limit stress.  Sit up straight, and do not tense your muscles.  Do not use tobacco products, including cigarettes, chewing tobacco, or e-cigarettes. If you need help quitting, ask your health care provider.  Exercise regularly as told by your health care  provider.  Sleep on a regular schedule. Get 7-9 hours of sleep, or the amount recommended by your health care provider. SEEK MEDICAL CARE IF:   Your symptoms are not helped by medicine.  You have a headache that is different from the usual headache.  You have nausea or you vomit.  You have a fever. SEEK IMMEDIATE MEDICAL CARE IF:   Your headache becomes severe.  You have repeated vomiting.  You have a stiff neck.  You have a loss of vision.  You have problems with speech.  You have pain in the eye or ear.  You have muscular weakness or loss of muscle control.  You lose your balance or have trouble walking.  You feel faint or pass out.  You have confusion.   This information is not intended to replace advice given to you by your health care provider. Make sure you discuss any questions you have with your health care provider.   Document Released: 04/30/2005 Document Revised: 01/19/2015 Document Reviewed: 08/23/2014 Elsevier Interactive Patient Education 2016 [employer]  Nausea and Vomiting Nausea is a sick feeling that often comes before throwing up (vomiting). Vomiting is a reflex where stomach contents come out of your mouth. Vomiting can cause severe loss of body fluids (dehydration). Children and elderly adults can become dehydrated quickly, especially if they also have diarrhea. Nausea and vomiting are symptoms of a condition or disease. It is important to find the cause of your symptoms. CAUSES   Direct irritation of the stomach lining. This irritation can result from increased acid production (gastroesophageal reflux disease), infection, food poisoning, taking certain medicines (such as nonsteroidal anti-inflammatory drugs), alcohol use, or tobacco use.  Signals from the brain.These signals could be caused by a headache, heat exposure, an inner ear disturbance, increased pressure in the brain from injury, infection, a tumor, or a concussion, pain, emotional  stimulus, or metabolic problems.  An obstruction in the gastrointestinal tract (bowel obstruction).  Illnesses such as diabetes, hepatitis, gallbladder problems, appendicitis, kidney problems, cancer, sepsis, atypical symptoms of a heart attack, or eating disorders.  Medical treatments such as chemotherapy and radiation.  Receiving medicine that makes you sleep (general anesthetic) during surgery. DIAGNOSIS Your caregiver may ask for tests to be done if the problems do not improve after a few days. Tests may also be done if symptoms are severe or if the reason for the nausea and vomiting is not clear. Tests may include:  Urine tests.  Blood tests.  Stool tests.  Cultures (to look for evidence of infection).  X-rays or other imaging studies. Test results can help your caregiver make decisions about treatment or the need for additional tests. TREATMENT You need to stay well hydrated. Drink frequently but in small amounts.You may wish to drink water, sports drinks, clear broth, or eat frozen ice pops or gelatin dessert to help stay hydrated.When you eat, eating slowly may help prevent nausea.There are also some antinausea medicines that may help prevent nausea. HOME CARE INSTRUCTIONS   Take  all medicine as directed by your caregiver.  If you do not have an appetite, do not force yourself to eat. However, you must continue to drink fluids.  If you have an appetite, eat a normal diet unless your caregiver tells you differently.  Eat a variety of complex carbohydrates (rice, wheat, potatoes, bread), lean meats, yogurt, fruits, and vegetables.  Avoid high-fat foods because they are more difficult to digest.  Drink enough water and fluids to keep your urine clear or pale yellow.  If you are dehydrated, ask your caregiver for specific rehydration instructions. Signs of dehydration may include:  Severe thirst.  Dry lips and mouth.  Dizziness.  Dark urine.  Decreasing urine  frequency and amount.  Confusion.  Rapid breathing or pulse. SEEK IMMEDIATE MEDICAL CARE IF:   You have blood or brown flecks (like coffee grounds) in your vomit.  You have black or bloody stools.  You have a severe headache or stiff neck.  You are confused.  You have severe abdominal pain.  You have chest pain or trouble breathing.  You do not urinate at least once every 8 hours.  You develop cold or clammy skin.  You continue to vomit for longer than 24 to 48 hours.  You have a fever. MAKE SURE YOU:   Understand these instructions.  Will watch your condition.  Will get help right away if you are not doing well or get worse.   This information is not intended to replace advice given to you by your health care provider. Make sure you discuss any questions you have with your health care provider.   Document Released: 04/30/2005 Document Revised: 07/23/2011 Document Reviewed: 09/27/2010 Elsevier Interactive Patient Education [employer]

## 2015-07-11 NOTE — ED Notes (Signed)
Pt given fluids to drink for fluid challenge. Will assess ability to tolerate fluids.

## 2015-07-11 NOTE — ED Notes (Signed)
Nausea, vomiting and headache since this am. States unable to keep even fluids down.

## 2015-07-11 NOTE — ED Notes (Signed)
Pt tolerating fluids well. Pt reports nausea and headache have improved.

## 2016-04-19 ENCOUNTER — Emergency Department: Payer: Medicaid Other

## 2016-04-19 ENCOUNTER — Encounter: Payer: Self-pay | Admitting: Urgent Care

## 2016-04-19 DIAGNOSIS — Z79899 Other long term (current) drug therapy: Secondary | ICD-10-CM | POA: Insufficient documentation

## 2016-04-19 DIAGNOSIS — O26891 Other specified pregnancy related conditions, first trimester: Secondary | ICD-10-CM | POA: Diagnosis present

## 2016-04-19 DIAGNOSIS — O99331 Smoking (tobacco) complicating pregnancy, first trimester: Secondary | ICD-10-CM | POA: Insufficient documentation

## 2016-04-19 DIAGNOSIS — F17210 Nicotine dependence, cigarettes, uncomplicated: Secondary | ICD-10-CM | POA: Insufficient documentation

## 2016-04-19 DIAGNOSIS — Z3A01 Less than 8 weeks gestation of pregnancy: Secondary | ICD-10-CM | POA: Insufficient documentation

## 2016-04-19 DIAGNOSIS — O200 Threatened abortion: Secondary | ICD-10-CM | POA: Insufficient documentation

## 2016-04-19 DIAGNOSIS — Z791 Long term (current) use of non-steroidal anti-inflammatories (NSAID): Secondary | ICD-10-CM | POA: Insufficient documentation

## 2016-04-19 LAB — URINALYSIS, COMPLETE (UACMP) WITH MICROSCOPIC
Bilirubin Urine: NEGATIVE
Glucose, UA: NEGATIVE mg/dL
Hgb urine dipstick: NEGATIVE
KETONES UR: 5 mg/dL — AB
Leukocytes, UA: NEGATIVE
Nitrite: NEGATIVE
PH: 5 (ref 5.0–8.0)
Protein, ur: NEGATIVE mg/dL
Specific Gravity, Urine: 1.026 (ref 1.005–1.030)

## 2016-04-19 LAB — BASIC METABOLIC PANEL
Anion gap: 7 (ref 5–15)
BUN: 16 mg/dL (ref 6–20)
CO2: 23 mmol/L (ref 22–32)
CREATININE: 0.64 mg/dL (ref 0.44–1.00)
Calcium: 8.7 mg/dL — ABNORMAL LOW (ref 8.9–10.3)
Chloride: 106 mmol/L (ref 101–111)
GFR calc Af Amer: >60 mL/min (ref >60)
GFR calc non Af Amer: >60 mL/min (ref >60)
Glucose, Bld: 127 mg/dL — ABNORMAL HIGH (ref 65–99)
Potassium: 3.9 mmol/L (ref 3.5–5.1)
SODIUM: 136 mmol/L (ref 135–145)

## 2016-04-19 LAB — CBC
HCT: 39.8 % (ref 35.0–47.0)
Hemoglobin: 13.9 g/dL (ref 12.0–16.0)
MCH: 30.6 pg (ref 26.0–34.0)
MCHC: 34.9 g/dL (ref 32.0–36.0)
MCV: 87.7 fL (ref 80.0–100.0)
PLATELETS: 224 10*3/uL (ref 150–440)
RBC: 4.54 MIL/uL (ref 3.80–5.20)
RDW: 12.6 % (ref 11.5–14.5)
WBC: 12 10*3/uL — ABNORMAL HIGH (ref 3.6–11.0)

## 2016-04-19 LAB — HCG, QUANTITATIVE, PREGNANCY: HCG, BETA CHAIN, QUANT, S: 13840 m[IU]/mL — AB (ref <5)

## 2016-04-19 LAB — POCT PREGNANCY, URINE: Preg Test, Ur: POSITIVE — AB

## 2016-04-19 NOTE — ED Triage Notes (Signed)
Patient presents to the ED tonight with complaints of a 3-4 day history of worsening abdominal cramps. Of note, patient reporting that she is "about 5 weeks"; LMP 11/3; has had no PNC to this point; patient is a G3P2.

## 2016-04-19 NOTE — ED Notes (Signed)
Pt ambulatory to u/s accomp by u/s tech 

## 2016-04-20 ENCOUNTER — Emergency Department
Admission: EM | Admit: 2016-04-20 | Discharge: 2016-04-20 | Disposition: A | Discharge status: Home / Self Care | Payer: Medicaid Other | Attending: Emergency Medicine | Admitting: Emergency Medicine

## 2016-04-20 DIAGNOSIS — O200 Threatened abortion: Secondary | ICD-10-CM

## 2016-04-20 DIAGNOSIS — O26891 Other specified pregnancy related conditions, first trimester: Secondary | ICD-10-CM

## 2016-04-20 DIAGNOSIS — R109 Unspecified abdominal pain: Secondary | ICD-10-CM

## 2016-04-20 NOTE — ED Provider Notes (Signed)
[facility] Emergency Department Provider Note    First MD Initiated Contact with Patient 04/20/16 0004     (approximate)  I have reviewed the triage vital signs and the nursing notes.   HISTORY  Chief Complaint Nausea and Abdominal Cramping   HPI Margaret [name] Given is a 30 y.o. female G3 P2 approximately [redacted] weeks pregnant presents to the emergency department with four-day history of abdominal cramping radiation to her back. She admits to urinary urgency with only small volume of urine expressed. Patient denies any dysuria no hematuria. Patient denies any vaginal bleeding. Patient states that she has an appointment scheduled with OB/GYN does not have an appointment to date   History reviewed. No pertinent past medical history.  Patient Active Problem List   Diagnosis Date Noted  . Lump or mass in breast 11/12/2013  . Breast mass 10/27/2013    History reviewed. No pertinent surgical history.  Prior to Admission medications   Medication Sig Start Date End Date Taking? Authorizing Provider  metoCLOPramide (REGLAN) 10 MG tablet Take 1 tablet (10 mg total) by mouth 4 (four) times daily -  before meals and at bedtime. 07/11/15   Sharman Cheek, MD  naproxen (NAPROSYN) 500 MG tablet Take 1 tablet (500 mg total) by mouth 2 (two) times daily with a meal. 12/06/14   Tommi Rumps, PA-C    Allergies No known drug allergies  No family history on file.  Social History Social History  Substance Use Topics  . Smoking status: Current Some Day Smoker    Packs/day: 1.00    Years: 10.00    Types: Cigarettes    Last attempt to quit: 11/08/2013  . Smokeless tobacco: Never Used  . Alcohol use No    Review of Systems Constitutional: No fever/chills Eyes: No visual changes. ENT: No sore throat. Cardiovascular: Denies chest pain. Respiratory: Denies shortness of breath. Gastrointestinal: Positive for abdominal cramping.  No nausea, no vomiting.  No diarrhea.   No constipation. Genitourinary: Negative for dysuria. Musculoskeletal: Negative for back pain. Skin: Negative for rash. Neurological: Negative for headaches, focal weakness or numbness.  10-point ROS otherwise negative.  ____________________________________________   PHYSICAL EXAM:  VITAL SIGNS: ED Triage Vitals [04/19/16 2151]  Enc Vitals Group     BP 110/64     Pulse Rate 88     Resp 18     Temp 98.7 F (37.1 C)     Temp Source Oral     SpO2 99 %     Weight 128 lb (58.1 kg)     Height 5\' 7"  (1.702 m)     Head Circumference      Peak Flow      Pain Score 5     Pain Loc      Pain Edu?      Excl. in GC?     Constitutional: Alert and oriented. Well appearing and in no acute distress. Eyes: Conjunctivae are normal. PERRL. EOMI. Head: Atraumatic. Ears:  Healthy appearing ear canals and TMs bilaterally Nose: No congestion/rhinnorhea. Mouth/Throat: Mucous membranes are moist.  Oropharynx non-erythematous. Neck: No stridor.   Cardiovascular: Normal rate, regular rhythm. Good peripheral circulation. Grossly normal heart sounds. Respiratory: Normal respiratory effort.  No retractions. Lungs CTAB. Gastrointestinal: Soft and nontender. No distention.  Musculoskeletal: No lower extremity tenderness nor edema. No gross deformities of extremities. Neurologic:  Normal speech and language. No gross focal neurologic deficits are appreciated.  Skin:  Skin is warm, dry and intact. No  rash noted. Psychiatric: Mood and affect are normal. Speech and behavior are normal.  ____________________________________________   LABS (all labs ordered are listed, but only abnormal results are displayed)  Labs Reviewed  CBC - Abnormal; Notable for the following:       Result Value   WBC 12.0 (*)    All other components within normal limits  BASIC METABOLIC PANEL - Abnormal; Notable for the following:    Glucose, Bld 127 (*)    Calcium 8.7 (*)    All other components within normal limits    HCG, QUANTITATIVE, PREGNANCY - Abnormal; Notable for the following:    hCG, Beta Chain, Quant, S 13,840 (*)    All other components within normal limits  URINALYSIS, COMPLETE (UACMP) WITH MICROSCOPIC - Abnormal; Notable for the following:    Color, Urine YELLOW (*)    APPearance HAZY (*)    Ketones, ur 5 (*)    Bacteria, UA RARE (*)    Squamous Epithelial / LPF 0-5 (*)    All other components within normal limits  POCT PREGNANCY, URINE - Abnormal; Notable for the following:    Preg Test, Ur POSITIVE (*)    All other components within normal limits    RADIOLOGY I, [place] N Derrian Rodak, personally viewed and evaluated these images (plain radiographs) as part of my medical decision making, as well as reviewing the written report by the radiologist.  [place] Ob Comp Less 14 Wks  Result Date: 04/20/2016 CLINICAL DATA:  Back pain and cramping x3 days. No vaginal bleeding. EXAM: OBSTETRIC <14 WK US AND TRANSVAGINAL OB US TECHNIQUE: Both transabdominal and transvaginal ultrasound examinations were performed for complete evaluation of the gestation as well as the maternal uterus, adnexal regions, and pelvic cul-de-sac. Transvaginal technique was performed to assess early pregnancy. COMPARISON:  None. FINDINGS: Intrauterine gestational sac: Single Yolk sac:  Visualized. Embryo:  Not Visualized. Cardiac Activity: Not Visualized. Heart Rate: Not applicable MSD: 8.3  mm   5 w   4  d Subchorionic hemorrhage:  None visualized. Maternal uterus/adnexae: Retroverted uterus.  Unremarkable ovaries. IMPRESSION: Probable early intrauterine gestational sac with no yolk sac but no fetal pole or cardiac activity yet visualized. Recommend follow-up quantitative B-HCG levels and follow-up US in 14 days to confirm and assess viability. This recommendation follows SRU consensus guidelines: Malva Limes Engl J Med 2013; 161:[id number]:1443-51. Electronically Signed   By: Tollie Ethavid  Kwon M.D.   On: 04/20/2016 00:13   [place] Ob Transvaginal  Result Date:  04/20/2016 CLINICAL DATA:  Back pain and cramping x3 days. No vaginal bleeding. EXAM: OBSTETRIC <14 WK US AND TRANSVAGINAL OB US TECHNIQUE: Both transabdominal and transvaginal ultrasound examinations were performed for complete evaluation of the gestation as well as the maternal uterus, adnexal regions, and pelvic cul-de-sac. Transvaginal technique was performed to assess early pregnancy. COMPARISON:  None. FINDINGS: Intrauterine gestational sac: Single Yolk sac:  Visualized. Embryo:  Not Visualized. Cardiac Activity: Not Visualized. Heart Rate: Not applicable MSD: 8.3  mm   5 w   4  d Subchorionic hemorrhage:  None visualized. Maternal uterus/adnexae: Retroverted uterus.  Unremarkable ovaries. IMPRESSION: Probable early intrauterine gestational sac with no yolk sac but no fetal pole or cardiac activity yet visualized. Recommend follow-up quantitative B-HCG levels and follow-up US in 14 days to confirm and assess viability. This recommendation follows SRU consensus guidelines: Malva Limes Engl J Med 2013; 409:[id number]:1443-51. Electronically Signed   By: Tollie Ethavid  Kwon M.D.   On: 04/20/2016 00:13     Procedures  INITIAL IMPRESSION / ASSESSMENT AND PLAN / ED COURSE  Pertinent labs & imaging results that were available during my care of the patient were reviewed by me and considered in my medical decision making (see chart for details).  30 year old female G3 P2 presenting with abdominal cramping approximately [redacted] weeks pregnant. Patient denied any vaginal bleeding. I informed the patient of the ultrasound findings and necessity of following up with OB/GYN for repeat hCG Quant as well as repeat ultrasound in 2 weeks as recommended per SRU guidelines.  Clinical Course     ____________________________________________  FINAL CLINICAL IMPRESSION(S) / ED DIAGNOSES  Final diagnoses:  Threatened miscarriage  Abdominal pain during pregnancy in first trimester     MEDICATIONS GIVEN DURING THIS VISIT:  Medications -  No data to display   NEW OUTPATIENT MEDICATIONS STARTED DURING THIS VISIT:  New Prescriptions   No medications on file    Modified Medications   No medications on file    Discontinued Medications   No medications on file     Note:  This document was prepared using Dragon voice recognition software and may include unintentional dictation errors.    Darci Currentandolph N Kona Lover, MD 04/20/16 [phone]

## 2016-05-09 LAB — OB RESULTS CONSOLE GC/CHLAMYDIA
Chlamydia: NEGATIVE
GC PROBE AMP, GENITAL: NEGATIVE

## 2016-05-09 LAB — OB RESULTS CONSOLE HGB/HCT, BLOOD
HCT: 39 %
Hemoglobin: 13.1 g/dL

## 2016-05-09 LAB — OB RESULTS CONSOLE VARICELLA ZOSTER ANTIBODY, IGG: VARICELLA IGG: IMMUNE

## 2016-05-09 LAB — OB RESULTS CONSOLE ANTIBODY SCREEN: Antibody Screen: NEGATIVE

## 2016-05-09 LAB — OB RESULTS CONSOLE RPR: RPR: NONREACTIVE

## 2016-05-09 LAB — OB RESULTS CONSOLE RUBELLA ANTIBODY, IGM: RUBELLA: IMMUNE

## 2016-05-09 LAB — OB RESULTS CONSOLE PLATELET COUNT: Platelets: 223 10*3/uL

## 2016-05-09 LAB — OB RESULTS CONSOLE ABO/RH: RH TYPE: POSITIVE

## 2016-05-09 LAB — OB RESULTS CONSOLE HEPATITIS B SURFACE ANTIGEN: Hepatitis B Surface Ag: NEGATIVE

## 2016-05-09 LAB — HM PAP SMEAR

## 2016-05-09 LAB — OB RESULTS CONSOLE HIV ANTIBODY (ROUTINE TESTING): HIV: NONREACTIVE

## 2016-05-14 NOTE — L&D Delivery Note (Signed)
Delivery Note At 2:16 AM a viable female was delivered via Vaginal, Spontaneous Delivery (Presentation: ROP).  APGAR: 8, 9; weight pending.  Placenta status: delivered spontaneously, intact.  Cord: 3VC with the following complications: none.  Cord pH: n/a  Anesthesia:  epidural Episiotomy: None Lacerations: 1st degree;Vaginal Suture Repair: 3.0 vicryl Est. Blood Loss (mL): 350  Mom to postpartum.  Baby to Couplet care / Skin to Skin.  Called to see patient.  Mom pushed to deliver a viable female infant.  The head followed by shoulders, which delivered without difficulty, and the rest of the body.  A single nuchal cord noted and reduced.  Baby to mom's chest.  Cord clamped and cut after > 30 seconds delay.  No cord blood obtained.  Placenta delivered spontaneously, intact, with a 3-vessel cord.  First degree vaginal laceration repaired with 3-0 Vicryl with figure-of-eight for hemostasis.  All counts correct.  Hemostasis obtained with IV pitocin and fundal massage. EBL 350 mL.     Thomasene MohairStephen Isidora Laham, MD 12/24/2016, 2:33 AM

## 2016-06-17 ENCOUNTER — Encounter: Payer: Self-pay | Admitting: Physician Assistant

## 2016-06-17 ENCOUNTER — Emergency Department
Admission: EM | Admit: 2016-06-17 | Discharge: 2016-06-17 | Disposition: A | Discharge status: Home / Self Care | Payer: Medicaid Other | Attending: Emergency Medicine | Admitting: Emergency Medicine

## 2016-06-17 DIAGNOSIS — O99331 Smoking (tobacco) complicating pregnancy, first trimester: Secondary | ICD-10-CM | POA: Diagnosis not present

## 2016-06-17 DIAGNOSIS — Y9389 Activity, other specified: Secondary | ICD-10-CM | POA: Diagnosis not present

## 2016-06-17 DIAGNOSIS — F17210 Nicotine dependence, cigarettes, uncomplicated: Secondary | ICD-10-CM | POA: Diagnosis not present

## 2016-06-17 DIAGNOSIS — W268XXA Contact with other sharp object(s), not elsewhere classified, initial encounter: Secondary | ICD-10-CM | POA: Diagnosis not present

## 2016-06-17 DIAGNOSIS — O9A211 Injury, poisoning and certain other consequences of external causes complicating pregnancy, first trimester: Secondary | ICD-10-CM | POA: Diagnosis not present

## 2016-06-17 DIAGNOSIS — Z3A13 13 weeks gestation of pregnancy: Secondary | ICD-10-CM | POA: Insufficient documentation

## 2016-06-17 DIAGNOSIS — Z79899 Other long term (current) drug therapy: Secondary | ICD-10-CM | POA: Diagnosis not present

## 2016-06-17 DIAGNOSIS — Y92000 Kitchen of unspecified non-institutional (private) residence as  the place of occurrence of the external cause: Secondary | ICD-10-CM | POA: Insufficient documentation

## 2016-06-17 DIAGNOSIS — S61210A Laceration without foreign body of right index finger without damage to nail, initial encounter: Secondary | ICD-10-CM | POA: Insufficient documentation

## 2016-06-17 DIAGNOSIS — Y999 Unspecified external cause status: Secondary | ICD-10-CM | POA: Diagnosis not present

## 2016-06-17 MED ORDER — LIDOCAINE HCL (PF) 1 % IJ SOLN
5.0000 mL | Freq: Once | INTRAMUSCULAR | Status: AC
  Administered 2016-06-17: 5 mL
  Filled 2016-06-17: qty 5

## 2016-06-17 NOTE — ED Provider Notes (Signed)
[facility] Emergency Department Provider Note ____________________________________________  Time seen: 1450  I have reviewed the triage vital signs and the nursing notes.  HISTORY  Chief Complaint  Laceration  HPI Margaret BasemanHeather M [name] is a 31 y.o. female presents to the ED for evaluation of laceration to her index finger and the right hand. She describes she had just finished cleaning dishes, when she was reaching blindly into the drain catch, [place] me to obtain a cut to the end of her right middle finger. She denies any other injury at this time. She is unclear about what actually cut her finger. She does report a normal tetanus status at this time.  History reviewed. No pertinent past medical history.  Patient Active Problem List   Diagnosis Date Noted  . Lump or mass in breast 11/12/2013  . Breast mass 10/27/2013    History reviewed. No pertinent surgical history.  Prior to Admission medications   Medication Sig Start Date End Date Taking? Authorizing Provider  metoCLOPramide (REGLAN) 10 MG tablet Take 1 tablet (10 mg total) by mouth 4 (four) times daily -  before meals and at bedtime. 07/11/15   Sharman CheekPhillip Stafford, MD  naproxen (NAPROSYN) 500 MG tablet Take 1 tablet (500 mg total) by mouth 2 (two) times daily with a meal. 12/06/14   Tommi Rumpshonda L Summers, PA-C    Allergies Patient has no known allergies.  No family history on file.  Social History Social History  Substance Use Topics  . Smoking status: Current Some Day Smoker    Packs/day: 1.00    Years: 10.00    Types: Cigarettes    Last attempt to quit: 11/08/2013  . Smokeless tobacco: Never Used  . Alcohol use No    Review of Systems  Constitutional: Negative for fever. Cardiovascular: Negative for chest pain. Respiratory: Negative for shortness of breath. Musculoskeletal: Negative for back pain. Skin: Negative for rash. Index finger lac as above Neurological: Negative for headaches, focal  weakness or numbness. ____________________________________________  PHYSICAL EXAM:  VITAL SIGNS: ED Triage Vitals  Enc Vitals Group     BP 06/17/16 1429 109/65     Pulse Rate 06/17/16 1429 78     Resp 06/17/16 1429 20     Temp 06/17/16 1429 98.9 F (37.2 C)     Temp Source 06/17/16 1429 Oral     SpO2 06/17/16 1429 100 %     Weight 06/17/16 1430 136 lb (61.7 kg)     Height 06/17/16 1430 5\' 7"  (1.702 m)     Head Circumference --      Peak Flow --      Pain Score --      Pain Loc --      Pain Edu? --      Excl. in GC? --     Constitutional: Alert and oriented. Well appearing and in no distress. Head: Normocephalic and atraumatic. Cardiovascular: Normal distal pulses. Respiratory: Normal respiratory effort.  Musculoskeletal: No composite fist. Right index finger with a superficial semicircular flap laceration to the distal tip. No active bleeding is noted. Nontender with normal range of motion in all extremities.  Neurologic:  Normal gait without ataxia. Normal speech and language. No gross focal neurologic deficits are appreciated. Skin:  Skin is warm, dry and intact. No rash noted. ____________________________________________  LACERATION REPAIR Performed by: Kellie ShropshireJesse, [place] PA-S Authorized by: Lissa HoardMenshew, Anyi Fels V Bacon Consent: Verbal consent obtained. Risks and benefits: risks, benefits and alternatives were discussed Consent [name] by: patient Patient  identity confirmed: provided demographic data Prepped and Draped in normal sterile fashion Wound explored  Laceration Location: right index fingertip  Laceration Length: 1 cm  No Foreign Bodies seen or palpated  Anesthesia: transthecal infiltration  Local anesthetic: lidocaine 1% w/o epinephrine  Anesthetic total: 3 ml  Irrigation method: syringe Amount of cleaning: standard  Skin closure: 5-0 nylon  Number of sutures: 2  Technique: interrupted  Patient tolerance: Patient tolerated the procedure well with no  immediate complications. ____________________________________________  INITIAL IMPRESSION / ASSESSMENT AND PLAN / ED COURSE  Patient with a right index finger laceration status post suture repair. The superficial laceration is dressed appropriately the patient is discharged with wound care instructions care provider or return to the ED as needed. ____________________________________________  FINAL CLINICAL IMPRESSION(S) / ED DIAGNOSES  Final diagnoses:  Laceration of right index finger without foreign body without damage to nail, initial encounter     Lissa Hoard, PA-C 06/17/16 1718    Jeanmarie Plant, MD 06/17/16 2027

## 2016-06-17 NOTE — ED Notes (Signed)

## 2016-06-17 NOTE — ED Notes (Signed)
Patient states that she was cleaning the drain in her sink and cut her finger. Patient has laceration to the right 2nd digit. Bleeding is controlled at this time. Gauze dressing in was placed in triage.

## 2016-06-17 NOTE — ED Triage Notes (Signed)
Pt states she was cleaning kitchen drain and cut 2nd digit right hand tip of finger. No bleeding at this time dressing in place

## 2016-06-17 NOTE — Discharge Instructions (Signed)
Keep the wound clean, dry, and covered. Wash with soap & water only. See your provider or an urgent care for suture removal as needed.

## 2016-06-25 ENCOUNTER — Encounter: Payer: Self-pay | Admitting: Emergency Medicine

## 2016-06-25 ENCOUNTER — Emergency Department
Admission: EM | Admit: 2016-06-25 | Discharge: 2016-06-25 | Disposition: A | Discharge status: Home / Self Care | Payer: Medicaid Other | Attending: Emergency Medicine | Admitting: Emergency Medicine

## 2016-06-25 DIAGNOSIS — O99611 Diseases of the digestive system complicating pregnancy, first trimester: Secondary | ICD-10-CM | POA: Insufficient documentation

## 2016-06-25 DIAGNOSIS — F17210 Nicotine dependence, cigarettes, uncomplicated: Secondary | ICD-10-CM | POA: Insufficient documentation

## 2016-06-25 DIAGNOSIS — K029 Dental caries, unspecified: Secondary | ICD-10-CM | POA: Diagnosis not present

## 2016-06-25 DIAGNOSIS — Z3A14 14 weeks gestation of pregnancy: Secondary | ICD-10-CM | POA: Diagnosis not present

## 2016-06-25 DIAGNOSIS — O99331 Smoking (tobacco) complicating pregnancy, first trimester: Secondary | ICD-10-CM | POA: Diagnosis not present

## 2016-06-25 DIAGNOSIS — K0889 Other specified disorders of teeth and supporting structures: Secondary | ICD-10-CM

## 2016-06-25 MED ORDER — AMOXICILLIN 500 MG PO CAPS: 500.0000 mg | ORAL_CAPSULE | Freq: Three times a day (TID) | ORAL | 0 refills | Status: DC

## 2016-06-25 MED ORDER — LIDOCAINE VISCOUS 2 % MT SOLN: 5.0000 mL | Freq: Four times a day (QID) | OROMUCOSAL | 0 refills | Status: DC | PRN

## 2016-06-25 NOTE — ED Provider Notes (Signed)
[facility] Emergency Department Provider Note   ____________________________________________   First MD Initiated Contact with Patient 06/25/16 1829     (approximate)  I have reviewed the triage vital signs and the nursing notes.   HISTORY  Chief Complaint Dental Pain    HPI Margaret [name] Given is a 31 y.o. female patient complaining of toothache for one half weeks. She states she is [redacted] weeks pregnant. Patient states she does not have a dentist. Patient states she was sent to the ED by [facility] to get  referral to a dentist that accepts Medicaid pregnancy.Patient is rating the pain as 8/10. No palliative measures for this complaint.   History reviewed. No pertinent past medical history.  Patient Active Problem List   Diagnosis Date Noted  . Lump or mass in breast 11/12/2013  . Breast mass 10/27/2013    No past surgical history on file.  Prior to Admission medications   Medication Sig Start Date End Date Taking? Authorizing Provider  amoxicillin (AMOXIL) 500 MG capsule Take 1 capsule (500 mg total) by mouth 3 (three) times daily. 06/25/16   Joni Reining, PA-C  lidocaine (XYLOCAINE) 2 % solution Use as directed 5 mLs in the mouth or throat every 6 (six) hours as needed for mouth pain. oral swish for times a day as needed. 06/25/16   Joni Reining, PA-C  metoCLOPramide (REGLAN) 10 MG tablet Take 1 tablet (10 mg total) by mouth 4 (four) times daily -  before meals and at bedtime. 07/11/15   Sharman Cheek, MD  naproxen (NAPROSYN) 500 MG tablet Take 1 tablet (500 mg total) by mouth 2 (two) times daily with a meal. 12/06/14   Tommi Rumps, PA-C    Allergies Patient has no known allergies.  No family history on file.  Social History Social History  Substance Use Topics  . Smoking status: Current Some Day Smoker    Packs/day: 1.00    Years: 10.00    Types: Cigarettes    Last attempt to quit: 11/08/2013  . Smokeless tobacco: Never Used  .  Alcohol use No    Review of Systems Constitutional: No fever/chills Eyes: No visual changes. ENT: No sore throat.Dental pain Cardiovascular: Denies chest pain. Respiratory: Denies shortness of breath. Gastrointestinal: No abdominal pain.  No nausea, no vomiting.  No diarrhea.  No constipation. Genitourinary: Negative for dysuria. Musculoskeletal: Negative for back pain. Skin: Negative for rash. Neurological: Negative for headaches, focal weakness or numbness.   ____________________________________________   PHYSICAL EXAM:  VITAL SIGNS: ED Triage Vitals  Enc Vitals Group     BP 06/25/16 1806 109/73     Pulse Rate 06/25/16 1806 83     Resp 06/25/16 1806 20     Temp 06/25/16 1806 98 F (36.7 C)     Temp Source 06/25/16 1806 Oral     SpO2 06/25/16 1806 100 %     Weight 06/25/16 1807 136 lb (61.7 kg)     Height 06/25/16 1807 5\' 7"  (1.702 m)     Head Circumference --      Peak Flow --      Pain Score 06/25/16 1810 8     Pain Loc --      Pain Edu? --      Excl. in GC? --     Constitutional: Alert and oriented. Well appearing and in no acute distress. Eyes: Conjunctivae are normal. PERRL. EOMI. Head: Atraumatic. Nose: No congestion/rhinnorhea. Mouth/Throat: Mucous membranes are moist.  Oropharynx non-erythematous. Multiple caries right molar area. Mild gingival edema.  Neck: No stridor.  No cervical spine tenderness to palpation. Hematological/Lymphatic/Immunilogical: No cervical lymphadenopathy. Cardiovascular: Normal rate, regular rhythm. Grossly normal heart sounds.  Good peripheral circulation. Respiratory: Normal respiratory effort.  No retractions. Lungs CTAB. Gastrointestinal: Soft and nontender. No distention. No abdominal bruits. No CVA tenderness. Musculoskeletal: No lower extremity tenderness nor edema.  No joint effusions. Neurologic:  Normal speech and language. No gross focal neurologic deficits are appreciated. No gait instability. Skin:  Skin is warm, dry  and intact. No rash noted. Psychiatric: Mood and affect are normal. Speech and behavior are normal.  ____________________________________________   LABS (all labs ordered are listed, but only abnormal results are displayed)  Labs Reviewed - No data to display ____________________________________________  EKG   ____________________________________________  RADIOLOGY   ____________________________________________   PROCEDURES  Procedure(s) performed: None  Procedures  Critical Care performed: No  ____________________________________________   INITIAL IMPRESSION / ASSESSMENT AND PLAN / ED COURSE  Pertinent labs & imaging results that were available during my care of the patient were reviewed by me and considered in my medical decision making (see chart for details).  Dental pain secondary to multiple caries. Patient given discharge care instructions. Patient advised to follow-up with list of dental clinics provided. Patient given a prescription for amoxicillin viscous lidocaine.      ____________________________________________   FINAL CLINICAL IMPRESSION(S) / ED DIAGNOSES  Final diagnoses:  Pain, dental      NEW MEDICATIONS STARTED DURING THIS VISIT:  New Prescriptions   AMOXICILLIN (AMOXIL) 500 MG CAPSULE    Take 1 capsule (500 mg total) by mouth 3 (three) times daily.   LIDOCAINE (XYLOCAINE) 2 % SOLUTION    Use as directed 5 mLs in the mouth or throat every 6 (six) hours as needed for mouth pain. oral swish for times a day as needed.     Note:  This document was prepared using Dragon voice recognition software and may include unintentional dictation errors.    Joni Reiningonald K Tomer Chalmers, PA-C 06/25/16 1842    Loleta Roseory Forbach, MD 06/25/16 2021

## 2016-06-25 NOTE — Discharge Instructions (Signed)
May chose from list of dental clinic provided. OPTIONS FOR DENTAL FOLLOW UP CARE  [place] Department of Health and Human Services - Local Safety Net Dental Clinics [email or web address]://www.ncdhhs.gov/dph/oralhealth/services/safetynetclinics.htm   [facility] [phone])  [facility] [phone])  [facility] [phone]  [facility] [phone])  [facility] [phone]) This clinic caters to the indigent population and is on a lottery system. Location: [employer] [address] [facility], [address], [place] [facility] Hours: Wednesdays from 6pm - 9pm, patients seen by a lottery system. For dates, call or go to [email or web address] Services: Cleanings, fillings and simple extractions. Payment Options: DENTAL WORK IS FREE OF CHARGE. Bring proof of income or support. Best way to get seen: Arrive at 5:15 pm - this is a lottery, NOT first come/first serve, so arriving earlier will not increase your chances of being seen.     [facility] [phone] Select option 1 for emergencies   Location: [facility], [facility], [address], [place] [facility] Hours: No walk-ins accepted - call the day before to schedule an appointment. Check in times are 9:30 am and 1:30 pm. Services: Simple extractions, temporary fillings, pulpectomy/pulp debridement, uncomplicated abscess drainage. Payment Options: PAYMENT IS DUE AT THE TIME OF SERVICE.  Fee is usually $100-200, additional surgical procedures (e.g. abscess drainage) may be extra. Cash, checks, Visa/MasterCard accepted.  Can file Medicaid if patient is covered for dental - patient should call case worker to check. No discount for [facility] patients. Best way to get seen: MUST call the day before and get onto the schedule. Can usually be seen the next 1-2 days. No walk-ins accepted.      [facility] [phone]   Location: [facility], [address], [place] [facility] Hours: M, W, Th, F 8am or 1:30pm, Tues 9a or 1:30 - first come/first served. Services: Simple extractions, temporary fillings, uncomplicated abscess drainage.  You do not need to be an [place] resident. Payment Options: PAYMENT IS DUE AT THE TIME OF SERVICE. Dental insurance, otherwise sliding scale - bring proof of income or support. Depending on income and treatment needed, cost is usually $50-200. Best way to get seen: Arrive early as it is first come/first served.     [facility] [phone]   Location: [address] Clinic Hours: Mon-Thu 8a-5p Services: Most basic dental services including extractions and fillings. Payment Options: PAYMENT IS DUE AT THE TIME OF SERVICE. Sliding scale, up to 50% off - bring proof if income or support. Medicaid with dental option accepted. Best way to get seen: Call to schedule an appointment, can usually be seen within 2 weeks OR they will try to see walk-ins - show up at 8a or 2p (you may have to wait).     [facility] [phone] [place] RESIDENTS ONLY   Location: [facility], [address], [place], [place] [address] Clinic Hours: By appointment only. Monday - Thursday 8am-5pm, Friday 8am-12pm Services: Cleanings, fillings, extractions. Payment Options: PAYMENT IS DUE AT THE TIME OF SERVICE. Cash, Visa or MasterCard. Sliding scale - $30 minimum per service. Best way to get seen: Come in to office, complete packet and make an appointment - need proof of income or support monies for each household member and proof of [place] residence. Usually takes about a month to get in.     [facility] °[phone] °  °Location: °[address] [place] °Clinic Hours: Walk-in Urgent Care  Dental Services are offered Monday-Friday mornings only. °The numbers of emergencies accepted daily is limited to the number of °providers available. °Maximum 15 - Mondays, Wednesdays & Thursdays °Maximum 10 - Tuesdays & Fridays °Services: °You do not need to be a [place] resident to be seen for a dental emergency. °Emergencies are defined as pain, swelling, abnormal bleeding, or dental trauma. Walkins will receive x-rays if needed. °NOTE: Dental cleaning is not an emergency. °Payment Options: °PAYMENT IS DUE AT THE TIME OF SERVICE. °Minimum co-pay is $40.00 for uninsured patients. °Minimum co-pay is $3.00 for Medicaid with dental coverage. °Dental Insurance is accepted and must be presented at time of visit. °Medicare does not cover dental. °Forms of payment: Cash, credit card, checks. °Best way to get seen: °If not previously registered with the clinic, walk-in dental registration begins at 7:15 am and is on a first come/first serve basis. °If previously registered with the clinic, call to make an appointment. °  °  °[facility] °[phone] °[place] RESIDENTS ONLY °  °Location: °[address], [place], [place] °Clinic Hours: °Mon-Thu 10a-2p °Services: Extractions only! °Payment Options: °FREE (donations accepted) - bring proof of income or support °Best way to get seen: °Call and schedule an appointment OR come at 8am on the 1st Monday of every month (except for holidays) when it is first come/first served. °  °  °[facility] °[phone] °  °Location: °[address], [place] °Clinic Hours: °Friday mornings °Services, Payment Options, Best way to get seen: °Call for info ° °

## 2016-06-25 NOTE — ED Triage Notes (Signed)
Pt reports toothache to right side of mouth x1.5 weeks. Pt reports she is [redacted] weeks pregnant.

## 2016-06-25 NOTE — ED Notes (Signed)
See triage note  States [facility] and needs a referral to dentist  Pain to right side of face and gumline

## 2016-07-17 ENCOUNTER — Other Ambulatory Visit: Payer: Self-pay | Admitting: Obstetrics and Gynecology

## 2016-07-17 DIAGNOSIS — Z3689 Encounter for other specified antenatal screening: Secondary | ICD-10-CM

## 2016-07-17 DIAGNOSIS — Z3A19 19 weeks gestation of pregnancy: Secondary | ICD-10-CM

## 2016-07-26 ENCOUNTER — Telehealth: Payer: Self-pay

## 2016-07-26 NOTE — Telephone Encounter (Signed)
Pt called front desk needing Dental Protocol [facility] [phone].  Form was faxed.

## 2016-08-02 ENCOUNTER — Ambulatory Visit (INDEPENDENT_AMBULATORY_CARE_PROVIDER_SITE_OTHER): Payer: Medicaid Other | Admitting: Certified Nurse Midwife

## 2016-08-02 ENCOUNTER — Telehealth: Payer: Self-pay

## 2016-08-02 ENCOUNTER — Ambulatory Visit (INDEPENDENT_AMBULATORY_CARE_PROVIDER_SITE_OTHER): Payer: Medicaid Other

## 2016-08-02 ENCOUNTER — Encounter: Payer: Self-pay | Admitting: Certified Nurse Midwife

## 2016-08-02 VITALS — BP 102/52 | HR 91 | Wt 142.0 lb

## 2016-08-02 DIAGNOSIS — O0992 Supervision of high risk pregnancy, unspecified, second trimester: Secondary | ICD-10-CM

## 2016-08-02 DIAGNOSIS — Z3A19 19 weeks gestation of pregnancy: Secondary | ICD-10-CM | POA: Diagnosis not present

## 2016-08-02 DIAGNOSIS — O4402 Placenta previa specified as without hemorrhage, second trimester: Secondary | ICD-10-CM

## 2016-08-02 DIAGNOSIS — Z3689 Encounter for other specified antenatal screening: Secondary | ICD-10-CM | POA: Diagnosis not present

## 2016-08-02 DIAGNOSIS — O2242 Hemorrhoids in pregnancy, second trimester: Secondary | ICD-10-CM

## 2016-08-02 DIAGNOSIS — R87610 Atypical squamous cells of undetermined significance on cytologic smear of cervix (ASC-US): Secondary | ICD-10-CM

## 2016-08-02 DIAGNOSIS — Z362 Encounter for other antenatal screening follow-up: Secondary | ICD-10-CM

## 2016-08-02 NOTE — Telephone Encounter (Signed)
Please advise 

## 2016-08-02 NOTE — Progress Notes (Addendum)
Anatomy scan today: CGA 20wk3d, breech, anterior placenta previa.  It's a girl!   Discussed being on pelvic rest and repeating ultrasound in future to see if previa resolves.  To report vaginal bleeding. Also discussed stopping smoking. , has been decreasing amt of smoking. Has small external hemorrhoid. Samples of Analpram HC 2.5% given Margaret [name], CNM

## 2016-08-02 NOTE — Telephone Encounter (Signed)
Patient called and advised that the edge of the placenta is covering the cx os and it has a good chance of migrating away from the cx.

## 2016-08-02 NOTE — Telephone Encounter (Signed)
Pt seen by CLG earlier today & told she has Placenta Previa. Patient inquiring if this is partial or full?

## 2016-08-05 ENCOUNTER — Emergency Department: Payer: Medicaid Other

## 2016-08-05 ENCOUNTER — Emergency Department
Admission: EM | Admit: 2016-08-05 | Discharge: 2016-08-05 | Disposition: A | Discharge status: Home / Self Care | Payer: Medicaid Other | Attending: Emergency Medicine | Admitting: Emergency Medicine

## 2016-08-05 ENCOUNTER — Encounter: Payer: Self-pay | Admitting: Emergency Medicine

## 2016-08-05 DIAGNOSIS — Z3A20 20 weeks gestation of pregnancy: Secondary | ICD-10-CM | POA: Diagnosis not present

## 2016-08-05 DIAGNOSIS — O99512 Diseases of the respiratory system complicating pregnancy, second trimester: Secondary | ICD-10-CM | POA: Diagnosis not present

## 2016-08-05 DIAGNOSIS — J209 Acute bronchitis, unspecified: Secondary | ICD-10-CM | POA: Diagnosis not present

## 2016-08-05 DIAGNOSIS — Z87891 Personal history of nicotine dependence: Secondary | ICD-10-CM | POA: Diagnosis not present

## 2016-08-05 DIAGNOSIS — R05 Cough: Secondary | ICD-10-CM | POA: Diagnosis not present

## 2016-08-05 DIAGNOSIS — O26892 Other specified pregnancy related conditions, second trimester: Secondary | ICD-10-CM | POA: Diagnosis present

## 2016-08-05 MED ORDER — AZITHROMYCIN 250 MG PO TABS: ORAL_TABLET | ORAL | 0 refills | Status: DC

## 2016-08-05 NOTE — ED Triage Notes (Signed)
Pt in via POV with complaints with coughing up blood tinged sputum x 2 days and right side sciatica x 1 week, worsening at night.  Pt denies any chest pain.  Pt [redacted] weeks pregnant, advised per OB to be evaluated here.

## 2016-08-05 NOTE — ED Provider Notes (Signed)
[facility] Emergency Department Provider Note   ____________________________________________   None    (approximate)  I have reviewed the triage vital signs and the nursing notes.   HISTORY  Chief Complaint Cough and Sciatica    HPI Margaret [name] Given is a 31 y.o. female    History reviewed. No pertinent past medical history.  Patient Active Problem List   Diagnosis Date Noted  . Lump or mass in breast 11/12/2013  . Breast mass 10/27/2013    History reviewed. No pertinent surgical history.  Prior to Admission medications   Medication Sig Start Date End Date Taking? Authorizing Provider  azithromycin (ZITHROMAX Z-PAK) 250 MG tablet Take 2 tablets (500 mg) on  Day 1,  followed by 1 tablet (250 mg) once daily on Days 2 through 5. 08/05/16   Nita Sickle, MD  Prenatal Vit-Fe Fumarate-FA (MULTIVITAMIN-PRENATAL) 27-0.8 MG TABS tablet Take 1 tablet by mouth daily at 12 noon.    Historical Provider, MD    Allergies Patient has no known allergies.  No family history on file.  Social History Social History  Substance Use Topics  . Smoking status: Former Smoker    Packs/day: 1.00    Years: 10.00    Types: Cigarettes    Quit date: 11/08/2013  . Smokeless tobacco: Never Used  . Alcohol use No    Review of Systems Constitutional: No fever/chills Eyes: No visual changes. ENT: No sore throat. Cardiovascular: Denies chest pain. Respiratory: Denies shortness of breath. Gastrointestinal: No abdominal pain.  No nausea, no vomiting.  No diarrhea.  No constipation. Genitourinary: Negative for dysuria. Musculoskeletal: Negative for back pain. Skin: Negative for rash. Neurological: Negative for headaches, focal weakness or numbness.  10-point ROS otherwise negative.  ____________________________________________   PHYSICAL EXAM:  VITAL SIGNS: ED Triage Vitals [08/05/16 1301]  Enc Vitals Group     BP 111/62     Pulse Rate 88     Resp 18      Temp 98.3 F (36.8 C)     Temp Source Oral     SpO2 98 %     Weight 144 lb (65.3 kg)     Height 5\' 7"  (1.702 m)     Head Circumference      Peak Flow      Pain Score      Pain Loc      Pain Edu?      Excl. in GC?     Constitutional: Alert and oriented. Well appearing and in no acute distress. Eyes: Conjunctivae are normal. PERRL. EOMI. Head: Atraumatic. Nose: No congestion/rhinnorhea. Mouth/Throat: Mucous membranes are moist.  Oropharynx non-erythematous. Neck: No stridor.   Cardiovascular: Normal rate, regular rhythm. Grossly normal heart sounds.  Good peripheral circulation. Respiratory: Normal respiratory effort.  No retractions. Lungs CTAB. Gastrointestinal: Soft and nontender. No distention. No abdominal bruits. No CVA tenderness. Musculoskeletal: No lower extremity tenderness nor edema.  No joint effusions. Neurologic:  Normal speech and language. No gross focal neurologic deficits are appreciated. No gait instability. Skin:  Skin is warm, dry and intact. No rash noted. Psychiatric: Mood and affect are normal. Speech and behavior are normal.  ____________________________________________   LABS (all labs ordered are listed, but only abnormal results are displayed)  Labs Reviewed - No data to display ____________________________________________  EKG   ____________________________________________  RADIOLOGY  Chest x-ray with no acute findings.  ____________________________________________   PROCEDURES  Procedure(s) performed: None  Procedures  Critical Care performed: No  ____________________________________________  INITIAL IMPRESSION / ASSESSMENT AND PLAN / ED COURSE  Pertinent labs & imaging results that were available during my care of the patient were reviewed by me and considered in my medical decision making (see chart for details).  Acute bronchitis. Rx given for Zithromax reassurance provided about a sciatica symptoms. Patient  follow-up PCP OB/GYN as directed and return to the ER if needed. Patient voices no other emergency medical complaints at this time.      ____________________________________________   FINAL CLINICAL IMPRESSION(S) / ED DIAGNOSES  Final diagnoses:  Acute bronchitis, unspecified organism      NEW MEDICATIONS STARTED DURING THIS VISIT:  Discharge Medication List as of 08/05/2016  2:14 PM    START taking these medications   Details  azithromycin (ZITHROMAX Z-PAK) 250 MG tablet Take 2 tablets (500 mg) on  Day 1,  followed by 1 tablet (250 mg) once daily on Days 2 through 5., Print         Note:  This document was prepared using Dragon voice recognition software and may include unintentional dictation errors.   Evangeline DakinCharles M Beers, PA-C 08/05/16 1505    Nita Sicklearolina Veronese, MD 08/07/16 1901

## 2016-08-13 DIAGNOSIS — O0990 Supervision of high risk pregnancy, unspecified, unspecified trimester: Secondary | ICD-10-CM | POA: Insufficient documentation

## 2016-08-13 DIAGNOSIS — O4402 Placenta previa specified as without hemorrhage, second trimester: Secondary | ICD-10-CM | POA: Insufficient documentation

## 2016-08-13 DIAGNOSIS — R87619 Unspecified abnormal cytological findings in specimens from cervix uteri: Secondary | ICD-10-CM | POA: Insufficient documentation

## 2016-08-31 ENCOUNTER — Encounter: Payer: Self-pay | Admitting: Certified Nurse Midwife

## 2016-08-31 ENCOUNTER — Ambulatory Visit (INDEPENDENT_AMBULATORY_CARE_PROVIDER_SITE_OTHER): Payer: Medicaid Other | Admitting: Certified Nurse Midwife

## 2016-08-31 VITALS — BP 92/52 | HR 92 | Wt 149.0 lb

## 2016-08-31 DIAGNOSIS — O4402 Placenta previa specified as without hemorrhage, second trimester: Secondary | ICD-10-CM

## 2016-08-31 DIAGNOSIS — Z3A24 24 weeks gestation of pregnancy: Secondary | ICD-10-CM

## 2016-08-31 DIAGNOSIS — O0992 Supervision of high risk pregnancy, unspecified, second trimester: Secondary | ICD-10-CM

## 2016-08-31 NOTE — Progress Notes (Signed)
Pt reports feeling pressure upon urination first thing in the morning but not throughout the day.

## 2016-08-31 NOTE — Progress Notes (Signed)
Had some left round ligament pain. Discussed treatment/prevention of round ligament pain No bleeding. Good FM ROB in 4 weeks for 28 week labs and ultrasound for placental location.

## 2016-09-19 ENCOUNTER — Telehealth: Payer: Self-pay

## 2016-09-19 NOTE — Telephone Encounter (Signed)
Pt called triage requesting call back but did not state reason for call. Left msg to call back. [phone]

## 2016-09-20 NOTE — Telephone Encounter (Signed)
Tried to call pt again. Left msg to call back.

## 2016-09-20 NOTE — Telephone Encounter (Signed)
Pt called.  Missed call. Please call back. [phone]

## 2016-09-20 NOTE — Telephone Encounter (Signed)
Pt c/o numbness and tingling on anterior of right thigh and pain while walking at work. Pt's job requires her to be on her feet most of her 8 hr day. Pt is able to take frequent breaks if needed. Sensation of tingling sometimes lasts for an hour but goes away when she sits. Advised pt to use belly support band, drink water and take breaks when needed. If pain or numbness continues or increases to schedule appt prior to next visit.

## 2016-09-28 ENCOUNTER — Ambulatory Visit: Payer: Medicaid Other

## 2016-09-28 ENCOUNTER — Ambulatory Visit (INDEPENDENT_AMBULATORY_CARE_PROVIDER_SITE_OTHER): Payer: Medicaid Other | Admitting: Advanced Practice Midwife

## 2016-09-28 ENCOUNTER — Other Ambulatory Visit: Payer: Medicaid Other

## 2016-09-28 VITALS — BP 100/66 | Wt 153.0 lb

## 2016-09-28 DIAGNOSIS — Z3A28 28 weeks gestation of pregnancy: Secondary | ICD-10-CM

## 2016-09-28 DIAGNOSIS — O0992 Supervision of high risk pregnancy, unspecified, second trimester: Secondary | ICD-10-CM

## 2016-09-28 DIAGNOSIS — O4402 Placenta previa specified as without hemorrhage, second trimester: Secondary | ICD-10-CM

## 2016-09-28 NOTE — Progress Notes (Signed)
Doing well. Good fetal movement. U/S today for placentation- now 1.62cm from cervix. Recheck at 34 weeks. Has some pain in right thigh- suggested stretching exercises and abdominal support.

## 2016-09-28 NOTE — Progress Notes (Signed)
U/S follow up/28 week labs today

## 2016-09-29 LAB — 28 WEEK RH+PANEL
Basophils Absolute: 0 10*3/uL (ref 0.0–0.2)
Basos: 0 %
EOS (ABSOLUTE): 0.3 10*3/uL (ref 0.0–0.4)
EOS: 3 %
Gestational Diabetes Screen: 74 mg/dL (ref 65–139)
HEMOGLOBIN: 11.5 g/dL (ref 11.1–15.9)
HIV SCREEN 4TH GENERATION: NONREACTIVE
Hematocrit: 33.8 % — ABNORMAL LOW (ref 34.0–46.6)
Immature Grans (Abs): 0 10*3/uL (ref 0.0–0.1)
Immature Granulocytes: 0 %
LYMPHS ABS: 1.7 10*3/uL (ref 0.7–3.1)
Lymphs: 17 %
MCH: 30.7 pg (ref 26.6–33.0)
MCHC: 34 g/dL (ref 31.5–35.7)
MCV: 90 fL (ref 79–97)
MONOCYTES: 8 %
Monocytes Absolute: 0.8 10*3/uL (ref 0.1–0.9)
NEUTROS ABS: 7.4 10*3/uL — AB (ref 1.4–7.0)
Neutrophils: 72 %
Platelets: 218 10*3/uL (ref 150–379)
RBC: 3.74 x10E6/uL — ABNORMAL LOW (ref 3.77–5.28)
RDW: 13.9 % (ref 12.3–15.4)
RPR Ser Ql: NONREACTIVE
WBC: 10.2 10*3/uL (ref 3.4–10.8)

## 2016-10-12 ENCOUNTER — Ambulatory Visit (INDEPENDENT_AMBULATORY_CARE_PROVIDER_SITE_OTHER): Payer: Medicaid Other | Admitting: Advanced Practice Midwife

## 2016-10-12 VITALS — BP 112/68 | Wt 160.0 lb

## 2016-10-12 DIAGNOSIS — Z3A30 30 weeks gestation of pregnancy: Secondary | ICD-10-CM

## 2016-10-12 MED ORDER — NESTABS ABC 32-1-200 MG PO MISC: 2.0000 | Freq: Every day | ORAL | 5 refills | Status: DC

## 2016-10-12 NOTE — Progress Notes (Signed)
Doing well. No LOF, VB. Good fetal movement. Request Rx for prenatal vitamins. Rx for Nestabs sent to pharm. Needs 34 week f/u scan for growth, placentation, and AFI.

## 2016-10-18 ENCOUNTER — Telehealth: Payer: Self-pay

## 2016-10-18 NOTE — Telephone Encounter (Signed)
Pt calling stating prn rx not at pharm.  Called pharm.  They have rx and are pretty sure it will go thru c ins.  Pt aware by detailed vm.

## 2016-10-23 ENCOUNTER — Telehealth: Payer: Self-pay

## 2016-10-23 NOTE — Telephone Encounter (Signed)
Pt was rx'd Nestabs and they're not made anymore per pharm.  Pt requsts new rx for pvn.  [phone] Phram was unable to look to see what m'caid would pay for as system was down.

## 2016-10-24 MED ORDER — PROVIDA OB 20-20-1.25 MG PO CAPS: ORAL_CAPSULE | ORAL | 3 refills | Status: DC

## 2016-10-24 NOTE — Telephone Encounter (Signed)
Pt called again today to ask PNV rx. JEG out of the office. Per CLG ok to send in [facility], covered by Medicaid. Left msg for pt rx sent in.

## 2016-10-25 ENCOUNTER — Telehealth: Payer: Self-pay

## 2016-10-25 NOTE — Telephone Encounter (Signed)
Pt calling to see when pnv would be calling in to pharm.  [facility]  Pt will go p/u.  In meantime, [facility] [address].

## 2016-10-29 ENCOUNTER — Ambulatory Visit (INDEPENDENT_AMBULATORY_CARE_PROVIDER_SITE_OTHER): Payer: Medicaid Other | Admitting: Obstetrics and Gynecology

## 2016-10-29 VITALS — BP 118/70 | Wt 166.0 lb

## 2016-10-29 DIAGNOSIS — O0990 Supervision of high risk pregnancy, unspecified, unspecified trimester: Secondary | ICD-10-CM

## 2016-10-29 DIAGNOSIS — Z3A32 32 weeks gestation of pregnancy: Secondary | ICD-10-CM

## 2016-10-29 DIAGNOSIS — O4402 Placenta previa specified as without hemorrhage, second trimester: Secondary | ICD-10-CM

## 2016-10-29 NOTE — Progress Notes (Signed)
No vb. No lof.  U/s nv for placentation.

## 2016-11-13 ENCOUNTER — Other Ambulatory Visit: Payer: Medicaid Other

## 2016-11-13 ENCOUNTER — Encounter: Payer: Medicaid Other | Admitting: Advanced Practice Midwife

## 2016-11-21 ENCOUNTER — Ambulatory Visit (INDEPENDENT_AMBULATORY_CARE_PROVIDER_SITE_OTHER): Payer: Medicaid Other

## 2016-11-21 ENCOUNTER — Other Ambulatory Visit: Payer: Self-pay | Admitting: Obstetrics and Gynecology

## 2016-11-21 ENCOUNTER — Ambulatory Visit (INDEPENDENT_AMBULATORY_CARE_PROVIDER_SITE_OTHER): Payer: Medicaid Other | Admitting: Advanced Practice Midwife

## 2016-11-21 VITALS — BP 118/74 | Wt 170.0 lb

## 2016-11-21 DIAGNOSIS — O0990 Supervision of high risk pregnancy, unspecified, unspecified trimester: Secondary | ICD-10-CM

## 2016-11-21 DIAGNOSIS — O4402 Placenta previa specified as without hemorrhage, second trimester: Secondary | ICD-10-CM | POA: Diagnosis not present

## 2016-11-21 DIAGNOSIS — Z3A35 35 weeks gestation of pregnancy: Secondary | ICD-10-CM

## 2016-11-21 NOTE — Progress Notes (Signed)
U/s today. No vb. No lof.  

## 2016-11-21 NOTE — Progress Notes (Signed)
Placenta is now 5.06 cm from cervix. Baby is 6 pounds 10 ounces, 66%, abdominal circ is 98%. AFI 14.95. Reviewed labor precautions. Having some pressure- occasional ctx. No LOF, VB. GBS/aptima nv.

## 2016-11-29 ENCOUNTER — Ambulatory Visit (INDEPENDENT_AMBULATORY_CARE_PROVIDER_SITE_OTHER): Payer: Medicaid Other | Admitting: Advanced Practice Midwife

## 2016-11-29 VITALS — BP 118/76 | Wt 172.0 lb

## 2016-11-29 DIAGNOSIS — Z3685 Encounter for antenatal screening for Streptococcus B: Secondary | ICD-10-CM

## 2016-11-29 DIAGNOSIS — Z113 Encounter for screening for infections with a predominantly sexual mode of transmission: Secondary | ICD-10-CM

## 2016-11-29 DIAGNOSIS — Z3A36 36 weeks gestation of pregnancy: Secondary | ICD-10-CM

## 2016-11-29 NOTE — Progress Notes (Signed)
Gbs/aptima today. No vb. No lof 

## 2016-11-29 NOTE — Progress Notes (Signed)
Doing well. Reviewed labor precautions. Having occasional contractions. No LOF, VB. GBS and aptima today. Cervix is visually closed.

## 2016-12-03 LAB — GC/CHLAMYDIA PROBE AMP
Chlamydia trachomatis, NAA: NEGATIVE
NEISSERIA GONORRHOEAE BY PCR: NEGATIVE

## 2016-12-03 LAB — STREP GP B NAA: Strep Gp B NAA: NEGATIVE

## 2016-12-06 ENCOUNTER — Ambulatory Visit (INDEPENDENT_AMBULATORY_CARE_PROVIDER_SITE_OTHER): Payer: Medicaid Other | Admitting: Advanced Practice Midwife

## 2016-12-06 VITALS — BP 118/74 | Wt 176.0 lb

## 2016-12-06 DIAGNOSIS — Z3A37 37 weeks gestation of pregnancy: Secondary | ICD-10-CM

## 2016-12-06 NOTE — Progress Notes (Signed)
Has had occasional contractions and may have lost mucous plug at work a couple days ago. Reviewed labor precautions.

## 2016-12-06 NOTE — Patient Instructions (Signed)
Vaginal Delivery Vaginal delivery means that you will give birth by pushing your baby out of your birth canal (vagina). A team of health care providers will help you before, during, and after vaginal delivery. Birth experiences are unique for every woman and every pregnancy, and birth experiences vary depending on where you choose to give birth. What should I do to prepare for my baby's birth? Before your baby is born, it is important to talk with your health care provider about:  Your labor and delivery preferences. These may include: ? Medicines that you may be given. ? How you will manage your pain. This might include non-medical pain relief techniques or injectable pain relief such as epidural analgesia. ? How you and your baby will be monitored during labor and delivery. ? Who may be in the labor and delivery room with you. ? Your feelings about surgical delivery of your baby (cesarean delivery, or C-section) if this becomes necessary. ? Your feelings about receiving donated blood through an IV tube (blood transfusion) if this becomes necessary.  Whether you are able: ? To take pictures or videos of the birth. ? To eat during labor and delivery. ? To move around, walk, or change positions during labor and delivery.  What to expect after your baby is born, such as: ? Whether delayed umbilical cord clamping and cutting is offered. ? Who will care for your baby right after birth. ? Medicines or tests that may be recommended for your baby. ? Whether breastfeeding is supported in your hospital or birth center. ? How long you will be in the hospital or birth center.  How any medical conditions you have may affect your baby or your labor and delivery experience.  To prepare for your baby's birth, you should also:  Attend all of your health care visits before delivery (prenatal visits) as recommended by your health care provider. This is important.  Prepare your home for your baby's  arrival. Make sure that you have: ? Diapers. ? Baby clothing. ? Feeding equipment. ? Safe sleeping arrangements for you and your baby.  Install a car seat in your vehicle. Have your car seat checked by a certified car seat installer to make sure that it is installed safely.  Think about who will help you with your new baby at home for at least the first several weeks after delivery.  What can I expect when I arrive at the birth center or hospital? Once you are in labor and have been admitted into the hospital or birth center, your health care provider may:  Review your pregnancy history and any concerns you have.  Insert an IV tube into one of your veins. This is used to give you fluids and medicines.  Check your blood pressure, pulse, temperature, and heart rate (vital signs).  Check whether your bag of water (amniotic sac) has broken (ruptured).  Talk with you about your birth plan and discuss pain control options.  Monitoring Your health care provider may monitor your contractions (uterine monitoring) and your baby's heart rate (fetal monitoring). You may need to be monitored:  Often, but not continuously (intermittently).  All the time or for long periods at a time (continuously). Continuous monitoring may be needed if: ? You are taking certain medicines, such as medicine to relieve pain or make your contractions stronger. ? You have pregnancy or labor complications.  Monitoring may be done by:  Placing a special stethoscope or a handheld monitoring device on your abdomen to   check your baby's heartbeat, and feeling your abdomen for contractions. This method of monitoring does not continuously record your baby's heartbeat or your contractions.  Placing monitors on your abdomen (external monitors) to record your baby's heartbeat and the frequency and length of contractions. You may not have to wear external monitors all the time.  Placing monitors inside of your uterus  (internal monitors) to record your baby's heartbeat and the frequency, length, and strength of your contractions. ? Your health care provider may use internal monitors if he or she needs more information about the strength of your contractions or your baby's heart rate. ? Internal monitors are put in place by passing a thin, flexible wire through your vagina and into your uterus. Depending on the type of monitor, it may remain in your uterus or on your baby's head until birth. ? Your health care provider will discuss the benefits and risks of internal monitoring with you and will ask for your permission before inserting the monitors.  Telemetry. This is a type of continuous monitoring that can be done with external or internal monitors. Instead of having to stay in bed, you are able to move around during telemetry. Ask your health care provider if telemetry is an option for you.  Physical exam Your health care provider may perform a physical exam. This may include:  Checking whether your baby is positioned: ? With the head toward your vagina (head-down). This is most common. ? With the head toward the top of your uterus (head-up or breech). If your baby is in a breech position, your health care provider may try to turn your baby to a head-down position so you can deliver vaginally. If it does not seem that your baby can be born vaginally, your provider may recommend surgery to deliver your baby. In rare cases, you may be able to deliver vaginally if your baby is head-up (breech delivery). ? Lying sideways (transverse). Babies that are lying sideways cannot be delivered vaginally.  Checking your cervix to determine: ? Whether it is thinning out (effacing). ? Whether it is opening up (dilating). ? How low your baby has moved into your birth canal.  What are the three stages of labor and delivery?  Normal labor and delivery is divided into the following three stages: Stage 1  Stage 1 is the  longest stage of labor, and it can last for hours or days. Stage 1 includes: ? Early labor. This is when contractions may be irregular, or regular and mild. Generally, early labor contractions are more than 10 minutes apart. ? Active labor. This is when contractions get longer, more regular, more frequent, and more intense. ? The transition phase. This is when contractions happen very close together, are very intense, and may last longer than during any other part of labor.  Contractions generally feel mild, infrequent, and irregular at first. They get stronger, more frequent (about every 2-3 minutes), and more regular as you progress from early labor through active labor and transition.  Many women progress through stage 1 naturally, but you may need help to continue making progress. If this happens, your health care provider may talk with you about: ? Rupturing your amniotic sac if it has not ruptured yet. ? Giving you medicine to help make your contractions stronger and more frequent.  Stage 1 ends when your cervix is completely dilated to 4 inches (10 cm) and completely effaced. This happens at the end of the transition phase. Stage 2  Once   your cervix is completely effaced and dilated to 4 inches (10 cm), you may start to feel an urge to push. It is common for the body to naturally take a rest before feeling the urge to push, especially if you received an epidural or certain other pain medicines. This rest period may last for up to 1-2 hours, depending on your unique labor experience.  During stage 2, contractions are generally less painful, because pushing helps relieve contraction pain. Instead of contraction pain, you may feel stretching and burning pain, especially when the widest part of your baby's head passes through the vaginal opening (crowning).  Your health care provider will closely monitor your pushing progress and your baby's progress through the vagina during stage 2.  Your  health care provider may massage the area of skin between your vaginal opening and anus (perineum) or apply warm compresses to your perineum. This helps it stretch as the baby's head starts to crown, which can help prevent perineal tearing. ? In some cases, an incision may be made in your perineum (episiotomy) to allow the baby to pass through the vaginal opening. An episiotomy helps to make the opening of the vagina larger to allow more room for the baby to fit through.  It is very important to breathe and focus so your health care provider can control the delivery of your baby's head. Your health care provider may have you decrease the intensity of your pushing, to help prevent perineal tearing.  After delivery of your baby's head, the shoulders and the rest of the body generally deliver very quickly and without difficulty.  Once your baby is delivered, the umbilical cord may be cut right away, or this may be delayed for 1-2 minutes, depending on your baby's health. This may vary among health care providers, hospitals, and birth centers.  If you and your baby are healthy enough, your baby may be placed on your chest or abdomen to help maintain the baby's temperature and to help you bond with each other. Some mothers and babies start breastfeeding at this time. Your health care team will dry your baby and help keep your baby warm during this time.  Your baby may need immediate care if he or she: ? Showed signs of distress during labor. ? Has a medical condition. ? Was born too early (prematurely). ? Had a bowel movement before birth (meconium). ? Shows signs of difficulty transitioning from being inside the uterus to being outside of the uterus. If you are planning to breastfeed, your health care team will help you begin a feeding. Stage 3  The third stage of labor starts immediately after the birth of your baby and ends after you deliver the placenta. The placenta is an organ that develops  during pregnancy to provide oxygen and nutrients to your baby in the womb.  Delivering the placenta may require some pushing, and you may have mild contractions. Breastfeeding can stimulate contractions to help you deliver the placenta.  After the placenta is delivered, your uterus should tighten (contract) and become firm. This helps to stop bleeding in your uterus. To help your uterus contract and to control bleeding, your health care provider may: ? Give you medicine by injection, through an IV tube, by mouth, or through your rectum (rectally). ? Massage your abdomen or perform a vaginal exam to remove any blood clots that are left in your uterus. ? Empty your bladder by placing a thin, flexible tube (catheter) into your bladder. ? Encourage   you to breastfeed your baby. After labor is over, you and your baby will be monitored closely to ensure that you are both healthy until you are ready to go home. Your health care team will teach you how to care for yourself and your baby. This information is not intended to replace advice given to you by your health care provider. Make sure you discuss any questions you have with your health care provider. Document Released: 02/07/2008 Document Revised: 11/18/2015 Document Reviewed: 05/15/2015 Elsevier Interactive Patient Education  2018 [employer]  

## 2016-12-07 ENCOUNTER — Telehealth: Payer: Self-pay

## 2016-12-07 MED ORDER — ESOMEPRAZOLE MAGNESIUM 20 MG PO CPDR: 20.0000 mg | DELAYED_RELEASE_CAPSULE | Freq: Every day | ORAL | 11 refills | Status: DC

## 2016-12-07 NOTE — Telephone Encounter (Signed)
Pt states she has used otc medication for heart burn, its not helping. Pt requesting a rx to be called in please

## 2016-12-13 ENCOUNTER — Ambulatory Visit (INDEPENDENT_AMBULATORY_CARE_PROVIDER_SITE_OTHER): Payer: Medicaid Other | Admitting: Certified Nurse Midwife

## 2016-12-13 VITALS — BP 110/62 | Wt 177.0 lb

## 2016-12-13 DIAGNOSIS — Z3493 Encounter for supervision of normal pregnancy, unspecified, third trimester: Secondary | ICD-10-CM

## 2016-12-13 DIAGNOSIS — Z3A38 38 weeks gestation of pregnancy: Secondary | ICD-10-CM

## 2016-12-13 NOTE — Progress Notes (Signed)
Lightheadedness/dizzy Vaginal pressure

## 2016-12-14 ENCOUNTER — Encounter: Payer: Self-pay | Admitting: Certified Nurse Midwife

## 2016-12-14 NOTE — Progress Notes (Signed)
Feeling lightheaded when standing for long hours at work. Lots of vaginal pressure Start maternity leave tomorrow. Baby active. No vaginal bleeding. Had a pink mucoid discharge last week. Cervix: 1/30%/-2/vtx Labor precautions Discussed BC options Breast feeding RTO 1 week

## 2016-12-20 ENCOUNTER — Ambulatory Visit (INDEPENDENT_AMBULATORY_CARE_PROVIDER_SITE_OTHER): Payer: Medicaid Other | Admitting: Advanced Practice Midwife

## 2016-12-20 VITALS — BP 114/70 | Wt 182.0 lb

## 2016-12-20 DIAGNOSIS — Z3A39 39 weeks gestation of pregnancy: Secondary | ICD-10-CM

## 2016-12-20 NOTE — Progress Notes (Signed)
No vb. Some contractions, cervical check today. Watery d/c

## 2016-12-20 NOTE — Progress Notes (Signed)
Having some irregular contractions. Decreased movement last night but feels normal today. Cervix swept and is 2.5/60/-2 today. Will schedule IOL for coming week. No LOF, VB.

## 2016-12-23 ENCOUNTER — Inpatient Hospital Stay: Payer: Medicaid Other | Admitting: Anesthesiology

## 2016-12-23 ENCOUNTER — Inpatient Hospital Stay
Admission: EM | Admit: 2016-12-23 | Discharge: 2016-12-26 | DRG: 774 | Disposition: A | Discharge status: Home / Self Care | Payer: Medicaid Other | Attending: Obstetrics and Gynecology | Admitting: Obstetrics and Gynecology

## 2016-12-23 ENCOUNTER — Encounter: Payer: Self-pay | Admitting: *Deleted

## 2016-12-23 DIAGNOSIS — O0990 Supervision of high risk pregnancy, unspecified, unspecified trimester: Secondary | ICD-10-CM

## 2016-12-23 DIAGNOSIS — Z3A40 40 weeks gestation of pregnancy: Secondary | ICD-10-CM | POA: Diagnosis not present

## 2016-12-23 DIAGNOSIS — Z87891 Personal history of nicotine dependence: Secondary | ICD-10-CM | POA: Diagnosis not present

## 2016-12-23 DIAGNOSIS — O3663X0 Maternal care for excessive fetal growth, third trimester, not applicable or unspecified: Secondary | ICD-10-CM | POA: Diagnosis not present

## 2016-12-23 DIAGNOSIS — O26893 Other specified pregnancy related conditions, third trimester: Secondary | ICD-10-CM | POA: Diagnosis present

## 2016-12-23 DIAGNOSIS — Z3493 Encounter for supervision of normal pregnancy, unspecified, third trimester: Secondary | ICD-10-CM

## 2016-12-23 HISTORY — DX: Unspecified abnormal cytological findings in specimens from vagina: R87.629

## 2016-12-23 LAB — TYPE AND SCREEN
ABO/RH(D): A POS
ANTIBODY SCREEN: NEGATIVE

## 2016-12-23 LAB — CBC
HEMATOCRIT: 37.2 % (ref 35.0–47.0)
HEMOGLOBIN: 12.8 g/dL (ref 12.0–16.0)
MCH: 30.7 pg (ref 26.0–34.0)
MCHC: 34.4 g/dL (ref 32.0–36.0)
MCV: 89.1 fL (ref 80.0–100.0)
Platelets: 210 10*3/uL (ref 150–440)
RBC: 4.17 MIL/uL (ref 3.80–5.20)
RDW: 13 % (ref 11.5–14.5)
WBC: 11.4 10*3/uL — ABNORMAL HIGH (ref 3.6–11.0)

## 2016-12-23 MED ORDER — CALCIUM CARBONATE ANTACID 500 MG PO CHEW
1.0000 | CHEWABLE_TABLET | ORAL | Status: DC | PRN
  Administered 2016-12-23 – 2016-12-24 (×2): 200 mg via ORAL
  Filled 2016-12-23 (×2): qty 1

## 2016-12-23 MED ORDER — MISOPROSTOL 200 MCG PO TABS
ORAL_TABLET | ORAL | Status: AC
  Filled 2016-12-23: qty 4

## 2016-12-23 MED ORDER — LIDOCAINE HCL (PF) 1 % IJ SOLN
INTRAMUSCULAR | Status: DC | PRN
  Administered 2016-12-23 (×2): 2 mL via SUBCUTANEOUS

## 2016-12-23 MED ORDER — PHENYLEPHRINE 40 MCG/ML (10ML) SYRINGE FOR IV PUSH (FOR BLOOD PRESSURE SUPPORT)
80.0000 ug | PREFILLED_SYRINGE | INTRAVENOUS | Status: DC | PRN
  Filled 2016-12-23: qty 5

## 2016-12-23 MED ORDER — OXYTOCIN 40 UNITS IN LACTATED RINGERS INFUSION - SIMPLE MED: 2.5000 [IU]/h | INTRAVENOUS | Status: DC

## 2016-12-23 MED ORDER — FENTANYL 2.5 MCG/ML W/ROPIVACAINE 0.15% IN NS 100 ML EPIDURAL (ARMC)
EPIDURAL | Status: DC | PRN
  Administered 2016-12-23: 12 mL/h via EPIDURAL

## 2016-12-23 MED ORDER — SOD CITRATE-CITRIC ACID 500-334 MG/5ML PO SOLN: 30.0000 mL | ORAL | Status: DC | PRN

## 2016-12-23 MED ORDER — SODIUM CHLORIDE 0.9 % IJ SOLN
INTRAMUSCULAR | Status: AC
  Filled 2016-12-23: qty 50

## 2016-12-23 MED ORDER — LACTATED RINGERS IV SOLN
INTRAVENOUS | Status: DC
  Administered 2016-12-23 (×2): via INTRAVENOUS

## 2016-12-23 MED ORDER — LACTATED RINGERS IV SOLN: 500.0000 mL | Freq: Once | INTRAVENOUS | Status: DC

## 2016-12-23 MED ORDER — LIDOCAINE HCL (PF) 1 % IJ SOLN
30.0000 mL | INTRAMUSCULAR | Status: DC | PRN
  Filled 2016-12-23: qty 30

## 2016-12-23 MED ORDER — AMMONIA AROMATIC IN INHA
RESPIRATORY_TRACT | Status: AC
  Filled 2016-12-23: qty 10

## 2016-12-23 MED ORDER — OXYTOCIN 40 UNITS IN LACTATED RINGERS INFUSION - SIMPLE MED
1.0000 m[IU]/min | INTRAVENOUS | Status: DC
  Administered 2016-12-23: 1 m[IU]/min via INTRAVENOUS
  Filled 2016-12-23: qty 1000

## 2016-12-23 MED ORDER — LACTATED RINGERS IV SOLN
500.0000 mL | INTRAVENOUS | Status: DC | PRN
  Administered 2016-12-23: 250 mL via INTRAVENOUS
  Administered 2016-12-23: 500 mL via INTRAVENOUS

## 2016-12-23 MED ORDER — DIPHENHYDRAMINE HCL 50 MG/ML IJ SOLN
12.5000 mg | INTRAMUSCULAR | Status: DC | PRN
  Administered 2016-12-24: 12.5 mg via INTRAVENOUS
  Filled 2016-12-23: qty 1

## 2016-12-23 MED ORDER — OXYTOCIN 10 UNIT/ML IJ SOLN
10.0000 [IU] | Freq: Once | INTRAMUSCULAR | Status: DC
  Filled 2016-12-23: qty 1

## 2016-12-23 MED ORDER — LIDOCAINE-EPINEPHRINE (PF) 1.5 %-1:200000 IJ SOLN
INTRAMUSCULAR | Status: DC | PRN
  Administered 2016-12-23: 3 mL via EPIDURAL

## 2016-12-23 MED ORDER — OXYTOCIN 40 UNITS IN LACTATED RINGERS INFUSION - SIMPLE MED
INTRAVENOUS | Status: AC
Start: 2016-12-23 — End: 2016-12-23
  Filled 2016-12-23: qty 1000

## 2016-12-23 MED ORDER — EPHEDRINE 5 MG/ML INJ
10.0000 mg | INTRAVENOUS | Status: DC | PRN
  Filled 2016-12-23: qty 2

## 2016-12-23 MED ORDER — FENTANYL 2.5 MCG/ML W/ROPIVACAINE 0.15% IN NS 100 ML EPIDURAL (ARMC)
EPIDURAL | Status: AC
  Filled 2016-12-23: qty 100

## 2016-12-23 MED ORDER — ONDANSETRON HCL 4 MG/2ML IJ SOLN
4.0000 mg | Freq: Four times a day (QID) | INTRAMUSCULAR | Status: DC | PRN
  Administered 2016-12-23: 4 mg via INTRAVENOUS
  Filled 2016-12-23: qty 2

## 2016-12-23 MED ORDER — TERBUTALINE SULFATE 1 MG/ML IJ SOLN: 0.2500 mg | Freq: Once | INTRAMUSCULAR | Status: DC | PRN

## 2016-12-23 MED ORDER — FENTANYL 2.5 MCG/ML W/ROPIVACAINE 0.15% IN NS 100 ML EPIDURAL (ARMC): 12.0000 mL/h | EPIDURAL | Status: DC

## 2016-12-23 MED ORDER — SODIUM CHLORIDE 0.9 % IV SOLN
INTRAVENOUS | Status: DC | PRN
  Administered 2016-12-23: 2 mL via EPIDURAL
  Administered 2016-12-23 (×2): 3 mL via EPIDURAL

## 2016-12-23 MED ORDER — OXYTOCIN BOLUS FROM INFUSION: 500.0000 mL | Freq: Once | INTRAVENOUS | Status: DC

## 2016-12-23 NOTE — Progress Notes (Signed)
Patient ID: [name], female   DOB: 11/25/1985, 31 y.o.   MRN: [id number]  Labor Check  Subj:  Complaints: more painful contractions   Obj:  BP (!) 105/59   Pulse 92   Temp 97.9 F (36.6 C) (Oral)   Resp 16   Ht 5\' 7"  (1.702 m)   Wt 182 lb (82.6 kg)   LMP 03/16/2016   BMI 28.51 kg/m  Dose (milli-units/min) Oxytocin: 10 milli-units/min  Cervix: Dilation: 4 / Effacement (%): 50 / Station: -2  Baseline FHR: 135 beats/min   Variability: moderate   Accelerations: present   Decelerations: absent Contractions: present frequency: 4-5 q 10 min Overall assessment: cat 1  A/P: 31 y.o. [id number] female at 2466w2d with elective IOL.  1.  Labor: will perform AROM after epidural, which she now requests  2.  FWB: reassuring, Overall assessment: category 1  3.  GBS neg  4.  Pain: epidural now 5.  Recheck: after comfortable with epidural for AROM. Fetus is engaged in pelvis.    Thomasene MohairStephen Iolanda Folson, MD 12/23/2016 5:07 PM

## 2016-12-23 NOTE — Anesthesia Preprocedure Evaluation (Signed)
Anesthesia Evaluation  Patient identified by MRN, date of birth, ID band Patient awake    Reviewed: Allergy & Precautions, H&P , NPO status , Patient's Chart, lab work & pertinent test results, reviewed documented beta blocker date and time   History of Anesthesia Complications Negative for: history of anesthetic complications  Airway Mallampati: I  TM Distance: >3 FB Neck ROM: full    Dental  (+) Teeth Intact   Pulmonary neg pulmonary ROS, former smoker,           Cardiovascular Exercise Tolerance: Good negative cardio ROS       Neuro/Psych negative neurological ROS  negative psych ROS   GI/Hepatic Neg liver ROS, GERD  Controlled,  Endo/Other  negative endocrine ROS  Renal/GU negative Renal ROS  negative genitourinary   Musculoskeletal   Abdominal   Peds  Hematology negative hematology ROS (+)   Anesthesia Other Findings Past Medical History: No date: Vaginal Pap smear, abnormal   Reproductive/Obstetrics (+) Pregnancy                             Anesthesia Physical Anesthesia Plan  ASA: II  Anesthesia Plan: Epidural   Post-op Pain Management:    Induction:   PONV Risk Score and Plan:   Airway Management Planned:   Additional Equipment:   Intra-op Plan:   Post-operative Plan:   Informed Consent: I have reviewed the patients History and Physical, chart, labs and discussed the procedure including the risks, benefits and alternatives for the proposed anesthesia with the patient or authorized representative who has indicated his/her understanding and acceptance.     Plan Discussed with: Anesthesiologist, CRNA and Surgeon  Anesthesia Plan Comments:         Anesthesia Quick Evaluation

## 2016-12-23 NOTE — Progress Notes (Signed)
Pt feeling pressure. SVE per RN. MD notified

## 2016-12-23 NOTE — Anesthesia Procedure Notes (Signed)
Epidural Patient location during procedure: OB Start time: 12/23/2016 5:35 PM End time: 12/23/2016 5:48 PM  Staffing Anesthesiologist: Lenard SimmerKARENZ, Jazzmine Kleiman Performed: anesthesiologist   Preanesthetic Checklist Completed: patient identified, site marked, surgical consent, pre-op evaluation, timeout performed, IV checked, risks and benefits discussed and monitors and equipment checked  Epidural Patient position: sitting Prep: ChloraPrep Patient monitoring: heart rate, continuous pulse ox and blood pressure Approach: midline Location: L3-L4 Injection technique: LOR saline  Needle:  Needle type: Tuohy  Needle gauge: 18 G Needle length: 9 cm and 9 Needle insertion depth: 5 cm Catheter type: closed end flexible Catheter size: 20 Guage Catheter at skin depth: 9 cm Test dose: negative and 1.5% lidocaine with Epi 1:200 K  Assessment Sensory level: T10 Events: blood not aspirated, injection not painful, no injection resistance, negative IV test and no paresthesia  Additional Notes Pt. Evaluated and documentation done after procedure finished. Patient identified. Risks/Benefits/Options discussed with patient including but not limited to bleeding, infection, nerve damage, paralysis, failed block, incomplete pain control, headache, blood pressure changes, nausea, vomiting, reactions to medication both or allergic, itching and postpartum back pain. Confirmed with bedside nurse the patient's most recent platelet count. Confirmed with patient that they are not currently taking any anticoagulation, have any bleeding history or any family history of bleeding disorders. Patient expressed understanding and wished to proceed. All questions were answered. Sterile technique was used throughout the entire procedure. Please see nursing notes for vital signs. Test dose was given through epidural catheter and negative prior to continuing to dose epidural or start infusion. Warning signs of high block given to the  patient including shortness of breath, tingling/numbness in hands, complete motor block, or any concerning symptoms with instructions to call for help. Patient was given instructions on fall risk and not to get out of bed. All questions and concerns addressed with instructions to call with any issues or inadequate analgesia.   Patient tolerated the insertion well without complications.  Reason for block:procedure for pain

## 2016-12-23 NOTE — Progress Notes (Signed)
Patient ID: [name], female   DOB: [birth year], 31 y.o.   MRN: [id number]  Labor Check  Subj:  Complaints: comfortable with epidural   Obj:  BP 112/69 (BP Location: Left Arm)   Pulse 90   Temp 98.4 F (36.9 C) (Oral)   Resp 18   Ht 5\' 7"  (1.702 m)   Wt 182 lb (82.6 kg)   LMP 03/16/2016   SpO2 100%   BMI 28.51 kg/m  Dose (milli-units/min) Oxytocin: 10 milli-units/min  Cervix: Dilation: 4 / Effacement (%): 50 / Station: -2   AROM - thick meconium Baseline FHR: 130 beats/min   Variability: moderate   Accelerations: present   Decelerations: absent Contractions: present frequency: 5 q 10 min Overall assessment: cat 1  A/P: 31 y.o. [id number] female at 4574w2d with IOL.  1.  Labor: AROM - thick meconium.  Discussed possible significance of meconium and need for special pediatric presence.  Reduce pitocin to 7 initially.  2.  FWB: reassuring, Overall assessment: category 1  3.  GBS neg  4.  Pain: epidural 5.  Recheck: 2 hours or prn   Thomasene MohairStephen Quill Grinder, MD 12/23/2016 7:49 PM

## 2016-12-23 NOTE — H&P (Signed)
OB History & Physical   History of Present Illness:  Chief Complaint: her for elective induction of labor  HPI:  Malyiah Fellows Given is a 31 y.o. G53P2002 female at [redacted]w[redacted]d dated by LMP.  Her pregnancy has been complicated by smoking, history of large for gestational age infant with G2.    She reports contractions.   She denies leakage of fluid.   She denies vaginal bleeding.   She reports fetal movement.    Maternal Medical History:   Past Medical History:  Diagnosis Date  . Vaginal Pap smear, abnormal     Past Surgical History:  Procedure Laterality Date  . TOOTH EXTRACTION      No Known Allergies  Prior to Admission medications   Medication Sig Start Date End Date Taking? Authorizing Provider  esomeprazole (NEXIUM) 20 MG capsule Take 1 capsule (20 mg total) by mouth daily at 12 noon. 12/07/16  Yes Tresea Mall, CNM  Prenat w/o A Vit-FeFum-FePo-FA (PROVIDA OB) 20-20-1.25 MG CAPS Take one capsule by mouth daily. 10/24/16  Yes Farrel Conners, CNM    OB History  Gravida Para Term Preterm AB Living  3 2 2     2   SAB TAB Ectopic Multiple Live Births          2    # Outcome Date GA Lbr Len/2nd Weight Sex Delivery Anes PTL Lv  3 Current           2 Term 09/30/08 [redacted]w[redacted]d  8 lb 11 oz (3.941 kg) M Vag-Spont   LIV  1 Term 01/12/06 [redacted]w[redacted]d  7 lb 11 oz (3.487 kg) M Vag-Spont   LIV    Obstetric Comments  1st Menstrual Cycle:  15  1st Pregnancy:  19    First labor was induced for postdates  Second labor augmented after PROM    Prenatal care site: [facility]  Social History: She  reports that she quit smoking about 2 years ago. She smoked 0.00 packs per day for 0.00 years. She has never used smokeless tobacco. She reports that she does not drink alcohol or use drugs.  Family History: She denies a family of gynecologic cancers  Review of Systems:  Review of Systems  Constitutional: Negative.   HENT: Negative.   Eyes: Negative.   Respiratory: Negative.   Cardiovascular:  Negative.   Gastrointestinal: Positive for abdominal pain (occasional contractions per HPI). Negative for blood in stool, constipation, diarrhea, heartburn, melena, nausea and vomiting.  Genitourinary: Negative.   Musculoskeletal: Negative.   Skin: Negative.   Neurological: Negative.   Psychiatric/Behavioral: Negative.      Physical Exam:  Vital Signs: BP 114/68   Pulse 89   Temp 98.9 F (37.2 C) (Oral)   Resp 16   Ht 5\' 7"  (1.702 m)   Wt 182 lb (82.6 kg)   LMP 03/16/2016   BMI 28.51 kg/m   Total Weight Gain: 50 lb (22.7 kg)  Constitutional: Well nourished, well developed female in no acute distress.  HEENT: normal Skin: Warm and dry.  Cardiovascular: Regular rate and rhythm.   Extremity: no edema  Respiratory: Clear to auscultation bilateral. Normal respiratory effort Abdomen: FHT present and gravid, nt Back: no CVAT Neuro: DTRs 2+, Cranial nerves grossly intact Psych: Alert and Oriented x3. No memory deficits. Normal mood and affect.  MS: normal gait, normal bilateral lower extremity ROM/strength/stability.  Pelvic exam: (female chaperone present) EGBUS: within normal limits Cervix: 2.5/50/-3/posterior/medium consistency  Foley bulb placed without difficulty   Pertinent Results:  Prenatal Labs: Blood type/Rh A positive  Antibody screen negative  Rubella Immune  Varicella Immune    RPR Nr  HBsAg negative  HIV negative  GC negative  Chlamydia negative  Genetic screening 1st trim screen negative  1 hour GTT 74  3 hour GTT n/a  GBS negative on 11/29/16   Baseline FHR: 140 beats/min   Variability: moderate   Accelerations: present   Decelerations: absent Contractions: present frequency: 3-4 q 10 min Overall assessment: category 1  Assessment:  [name] is a 31 y.o. 263P2002 female at 6045w2d with elective induction of labor for history of large for gestational age infant with G2.     Plan:  1. Admit to Labor & Delivery  2. CBC, T&S, Clrs, IVF 3. GBS  negative.   4. Fetwal well-being: reassuring Plan for foley bulb with switch to pitocin.  AROM when appropriate and safe.  Discussed risks/benefits for induction of labor prior to 41 weeks. History of large for gestational age infant. Uncertain benefit to early induction of labor and increased risk of cesarean delivery with obstetrical intervention.   Thomasene MohairStephen Trellis Vanoverbeke, MD 12/23/2016 9:58 AM

## 2016-12-24 DIAGNOSIS — Z3A40 40 weeks gestation of pregnancy: Secondary | ICD-10-CM

## 2016-12-24 LAB — CBC
HEMATOCRIT: 36.2 % (ref 35.0–47.0)
HEMOGLOBIN: 12.5 g/dL (ref 12.0–16.0)
MCH: 30.9 pg (ref 26.0–34.0)
MCHC: 34.4 g/dL (ref 32.0–36.0)
MCV: 89.8 fL (ref 80.0–100.0)
Platelets: 169 10*3/uL (ref 150–440)
RBC: 4.03 MIL/uL (ref 3.80–5.20)
RDW: 13.4 % (ref 11.5–14.5)
WBC: 21.8 10*3/uL — AB (ref 3.6–11.0)

## 2016-12-24 LAB — RPR: RPR Ser Ql: NONREACTIVE

## 2016-12-24 MED ORDER — ONDANSETRON HCL 4 MG/2ML IJ SOLN: 4.0000 mg | INTRAMUSCULAR | Status: DC | PRN

## 2016-12-24 MED ORDER — ACETAMINOPHEN 500 MG PO TABS
1000.0000 mg | ORAL_TABLET | Freq: Once | ORAL | Status: AC
  Administered 2016-12-24: 1000 mg via ORAL

## 2016-12-24 MED ORDER — BENZOCAINE-MENTHOL 20-0.5 % EX AERO
1.0000 "application " | INHALATION_SPRAY | CUTANEOUS | Status: DC | PRN
  Administered 2016-12-24: 1 via TOPICAL
  Filled 2016-12-24: qty 56

## 2016-12-24 MED ORDER — SENNOSIDES-DOCUSATE SODIUM 8.6-50 MG PO TABS
2.0000 | ORAL_TABLET | ORAL | Status: DC
  Administered 2016-12-24 – 2016-12-25 (×2): 2 via ORAL
  Filled 2016-12-24 (×2): qty 2

## 2016-12-24 MED ORDER — HYDROCODONE-ACETAMINOPHEN 5-325 MG PO TABS: 1.0000 | ORAL_TABLET | Freq: Four times a day (QID) | ORAL | Status: DC | PRN

## 2016-12-24 MED ORDER — PRENATAL MULTIVITAMIN CH
1.0000 | ORAL_TABLET | Freq: Every day | ORAL | Status: DC
  Administered 2016-12-24 – 2016-12-25 (×2): 1 via ORAL
  Filled 2016-12-24 (×3): qty 1

## 2016-12-24 MED ORDER — COCONUT OIL OIL: 1.0000 "application " | TOPICAL_OIL | Status: DC | PRN

## 2016-12-24 MED ORDER — SIMETHICONE 80 MG PO CHEW: 80.0000 mg | CHEWABLE_TABLET | ORAL | Status: DC | PRN

## 2016-12-24 MED ORDER — PANTOPRAZOLE SODIUM 40 MG PO TBEC
40.0000 mg | DELAYED_RELEASE_TABLET | Freq: Every day | ORAL | Status: DC
  Administered 2016-12-24 – 2016-12-26 (×3): 40 mg via ORAL
  Filled 2016-12-24 (×3): qty 1

## 2016-12-24 MED ORDER — ACETAMINOPHEN 325 MG PO TABS: 650.0000 mg | ORAL_TABLET | ORAL | Status: DC | PRN

## 2016-12-24 MED ORDER — FERROUS SULFATE 325 (65 FE) MG PO TABS
325.0000 mg | ORAL_TABLET | Freq: Two times a day (BID) | ORAL | Status: DC
  Administered 2016-12-24 – 2016-12-26 (×5): 325 mg via ORAL
  Filled 2016-12-24 (×5): qty 1

## 2016-12-24 MED ORDER — COMPLETENATE 29-1 MG PO CHEW: 1.0000 | CHEWABLE_TABLET | Freq: Every day | ORAL | Status: DC

## 2016-12-24 MED ORDER — ONDANSETRON HCL 4 MG PO TABS: 4.0000 mg | ORAL_TABLET | ORAL | Status: DC | PRN

## 2016-12-24 MED ORDER — WITCH HAZEL-GLYCERIN EX PADS: 1.0000 "application " | MEDICATED_PAD | CUTANEOUS | Status: DC | PRN

## 2016-12-24 MED ORDER — ACETAMINOPHEN 500 MG PO TABS
ORAL_TABLET | ORAL | Status: AC
  Administered 2016-12-24: 1000 mg via ORAL
  Filled 2016-12-24: qty 2

## 2016-12-24 MED ORDER — DIBUCAINE 1 % RE OINT: 1.0000 "application " | TOPICAL_OINTMENT | RECTAL | Status: DC | PRN

## 2016-12-24 MED ORDER — DIPHENHYDRAMINE HCL 25 MG PO CAPS: 25.0000 mg | ORAL_CAPSULE | Freq: Four times a day (QID) | ORAL | Status: DC | PRN

## 2016-12-24 MED ORDER — PROVIDA OB 20-20-1.25 MG PO CAPS: 1.0000 | ORAL_CAPSULE | Freq: Every day | ORAL | Status: DC

## 2016-12-24 MED ORDER — IBUPROFEN 600 MG PO TABS
600.0000 mg | ORAL_TABLET | Freq: Four times a day (QID) | ORAL | Status: DC
  Administered 2016-12-24 – 2016-12-25 (×5): 600 mg via ORAL
  Filled 2016-12-24 (×6): qty 1

## 2016-12-24 MED ORDER — SODIUM CHLORIDE 0.9% FLUSH
3.0000 mL | INTRAVENOUS | Status: DC | PRN
  Administered 2016-12-24: 3 mL via INTRAVENOUS
  Filled 2016-12-24: qty 3

## 2016-12-24 NOTE — Anesthesia Postprocedure Evaluation (Signed)
Anesthesia Post Note  Patient: [name]  Procedure(s) Performed: * No procedures listed *  Patient location during evaluation: Mother Baby Anesthesia Type: Epidural Level of consciousness: awake and alert Pain management: pain level controlled Vital Signs Assessment: post-procedure vital signs reviewed and stable Respiratory status: spontaneous breathing, nonlabored ventilation and respiratory function stable Cardiovascular status: stable Postop Assessment: no headache, no backache and epidural receding Anesthetic complications: no Comments: Pt states she has no headache.     Last Vitals:  Vitals:   12/24/16 1214 12/24/16 1637  BP: 104/63 (!) 99/51  Pulse: 81 81  Resp: 16 16  Temp: 36.6 C 36.5 C  SpO2: 99% 99%    Last Pain:  Vitals:   12/24/16 1637  TempSrc: Oral  PainSc:                  Rica MastBachich,  Sarayah Bacchi M

## 2016-12-24 NOTE — Plan of Care (Signed)
Problem: Life Cycle: Goal: Risk for postpartum hemorrhage will decrease Outcome: Progressing Lochial flow is small and fundus is midline a U-even. Mom had first degree laceration.   Problem: Urinary Elimination: Goal: Ability to reestablish a normal urinary elimination pattern will improve Outcome: Completed/Met Date Met: 12/24/16 Pt voided twice post partum.

## 2016-12-24 NOTE — Lactation Note (Signed)
This note was copied from a baby's chart. Lactation Consultation Note  Patient Name: Girl Imunique Given ZOXWR'UToHerbert Setaday's Date: 12/24/2016 Reason for consult: Follow-up assessment   Maternal Data Has patient been taught Hand Expression?: Yes Does the patient have breastfeeding experience prior to this delivery?: No  Feeding Feeding Type: Breast Fed Length of feed: 6 min  LATCH Score Latch: Repeated attempts needed to sustain latch, nipple held in mouth throughout feeding, stimulation needed to elicit sucking reflex.  Audible Swallowing: A few with stimulation  Type of Nipple: Flat  Comfort (Breast/Nipple): Soft / non-tender  Hold (Positioning): Assistance needed to correctly position infant at breast and maintain latch.  LATCH Score: 6  Interventions Interventions: Hand express;Assisted with latch;Breast feeding basics reviewed  Lactation Tools Discussed/Used Tools: Nipple Shields (Mom's nipples are flat, possibly IV and addt'l fluids) Nipple shield size: 16   Consult Status Consult Status: Follow-up Date: 12/25/16 Follow-up type: In-patient Parents were concerned about baby not eating and spitting up. Reiterated to parents (RNs have explained as well) that the baby spitting up and not wanting to eat every 3hrs is normal due to the amount of fluid infant may have consumed during delivery. Parents have been instructed to sit the baby in an upright position and on side while in bassinet until spitting up is resolved. Parents have also been instructed to watch for infant's feeding cues and to breast or bottle feed on-demand. LC will see pt in am.    Burnadette PeterJaniya M Tayson Schnelle 12/24/2016, 5:48 PM

## 2016-12-24 NOTE — Anesthesia Postprocedure Evaluation (Addendum)
Anesthesia Post Note  Patient: Margaret BasemanHeather M Given  Procedure(s) Performed: * No procedures listed *  Patient location during evaluation: Mother Baby Anesthesia Type: Epidural Level of consciousness: awake and alert Pain management: pain level controlled Vital Signs Assessment: post-procedure vital signs reviewed and stable Respiratory status: spontaneous breathing, nonlabored ventilation and respiratory function stable Cardiovascular status: stable Postop Assessment: no headache, no backache and epidural receding Anesthetic complications: no     Last Vitals:  Vitals:   12/24/16 0415 12/24/16 0435  BP:  (!) 110/59  Pulse:  85  Resp:  19  Temp: 36.8 C 37.1 C  SpO2:  98%    Last Pain:  Vitals:   12/24/16 0648  TempSrc:   PainSc: 0-No pain                 Marlana SalvageSandra Raul Torrance

## 2016-12-24 NOTE — Addendum Note (Signed)
Addendum  created 12/24/16 1754 by Irving BurtonBachich, Infiniti Hoefling, CRNA   Sign clinical note

## 2016-12-24 NOTE — Discharge Summary (Signed)
OB Discharge Summary     Patient Name: Margaret [name] Given DOB: 07-Apr-1986 MRN: [id number]  Date of admission: 12/23/2016 Delivering MD: Thomasene Mohair, MD  Date of Delivery: 12/24/2016  Date of discharge: 12/26/2016  Admitting diagnosis: Induction of labor Intrauterine pregnancy: [redacted]w[redacted]d     Secondary diagnosis: None     Discharge diagnosis: Term Pregnancy Delivered , Fetal macrosomia                                                                                            Post partum procedures:TDAP  Augmentation: AROM and Pitocin  Complications: maternal fever with pushing (102.47F)  Hospital course:  Induction of Labor With Vaginal Delivery   31 y.o. yo G3P2002 at [redacted]w[redacted]d was admitted to the hospital 12/23/2016 for induction of labor.  Indication for induction: Postdates and history of large-for-gestational age infant.  Patient had a labor complicated by fever during second stage and was otherwise uncomplicated as follows: Membrane Rupture Time/Date: 7:47 PM ,12/23/2016   Intrapartum Procedures: Episiotomy: None [1]                                         Lacerations:  1st degree [2];Vaginal [6]  Patient had delivery of a Viable infant.  Information for the patient's newborn:  Given, Girl Anslie [[id number]]  Delivery Method: Vag-Spont   12/24/2016  Details of delivery can be found in separate delivery note.  Patient had a routine postpartum course. She has been afebrile since the delivery.  Patient is discharged home 12/26/16.  Physical exam  Vitals:   12/25/16 0800 12/25/16 1102 12/25/16 1931 12/26/16 0842  BP: 104/61  (!) 105/57 105/64  Pulse: 68  82 71  Resp: 18  18 18   Temp: 97.7 F (36.5 C) 97.9 F (36.6 C) 98.5 F (36.9 C) 97.7 F (36.5 C)  TempSrc: Oral Oral Oral Oral  SpO2: 100%   100%  Weight:      Height:       General: alert, cooperative and no distress Lochia: appropriate Uterine Fundus: firm/ U-1/ ML/ NT Incision: N/A DVT Evaluation: No evidence of DVT  seen on physical exam.  Labs: Lab Results  Component Value Date   WBC 21.8 (H) 12/24/2016   HGB 12.5 12/24/2016   HCT 36.2 12/24/2016   MCV 89.8 12/24/2016   PLT 169 12/24/2016    Discharge instruction: per After Visit Summary.  Medications:  Allergies as of 12/26/2016   No Known Allergies     Medication List    TAKE these medications   esomeprazole 20 MG capsule Commonly known as:  NEXIUM Take 1 capsule (20 mg total) by mouth daily at 12 noon.   ibuprofen 600 MG tablet Commonly known as:  ADVIL,MOTRIN Take 1 tablet (600 mg total) by mouth every 6 (six) hours as needed.   PROVIDA OB 20-20-1.25 MG Caps Take one capsule by mouth daily.       Diet: routine diet  Activity: Advance as tolerated. Pelvic rest for 6 weeks.   Outpatient follow up: Follow-up  Information    Conard NovakJackson, Stephen D, MD Follow up in 2 week(s).   Specialty:  Obstetrics and Gynecology Why:  depression check up. Contact information: [address] [place] [address] [phone]             Postpartum contraception: undecided Rhogam Given postpartum: no Rubella vaccine given postpartum: no Varicella vaccine given postpartum: no TDaP given antepartum or postpartum: given postpartum  Newborn Data: Live born female/ Milana Birth Weight:  9#4.5 oz APGAR: 8, 9   Baby Feeding: Bottle and Breast  Disposition:home with mother  SIGNED: Farrel Connersolleen Odessa Nishi, CNM

## 2016-12-24 NOTE — Anesthesia Post-op Follow-up Note (Signed)
  Anesthesia Pain Follow-up Note  Patient: Margaret BasemanHeather M [name]  Day #: 1  Date of Follow-up: 12/24/2016 Time: 7:17 AM  Last Vitals:  Vitals:   12/24/16 0415 12/24/16 0435  BP:  (!) 110/59  Pulse:  85  Resp:  19  Temp: 36.8 C 37.1 C  SpO2:  98%    Level of Consciousness: alert  Pain: none   Side Effects:None  Catheter Site Exam:clean, dry, no drainage     Plan: D/C from anesthesia care at surgeon's request  Marlana SalvageSandra Graviela Nodal

## 2016-12-24 NOTE — Lactation Note (Signed)
This note was copied from a baby's chart. Lactation Consultation Note  Patient Name: Margaret [name] Today's Date: 12/24/2016 Reason for consult: Initial assessment   Maternal Data Has patient been taught Hand Expression?: No Does the patient have breastfeeding experience prior to this delivery?: No  Feeding Feeding Type: Bottle Fed - Formula Nipple Type: Slow - flow  LATCH Score                   Interventions    Lactation Tools Discussed/Used     Consult Status Consult Status: PRN Parents have decided they want to bottlefeed. LC told parents they can breast and bottlefeed baby and to call out if they needed breastfeeding assistance or have questions/concerns.    Burnadette PeterJaniya M Kymari Lollis 12/24/2016, 10:43 AM

## 2016-12-24 NOTE — Progress Notes (Addendum)
  Subjective:  Doing well moderate lochia, no further fevers   Objective:   Blood pressure (!) 93/43, pulse 60, temperature 97.8 F (36.6 C), temperature source Oral, resp. rate 16, height 5\' 7"  (1.702 m), weight 182 lb (82.6 kg), last menstrual period 03/16/2016, SpO2 99 %, unknown if currently breastfeeding.  General: NAD Pulmonary: no increased work of breathing Abdomen: non-distended, non-tender, fundus firm at level of umbilicus Extremities: no edema, no erythema, no tenderness  Results for orders placed or performed during the hospital encounter of 12/23/16 (from the past 72 hour(s))  CBC     Status: Abnormal   Collection Time: 12/23/16  8:24 AM  Result Value Ref Range   WBC 11.4 (H) 3.6 - 11.0 K/uL   RBC 4.17 3.80 - 5.20 MIL/uL   Hemoglobin 12.8 12.0 - 16.0 g/dL   HCT 16.137.2 09.635.0 - 04.547.0 %   MCV 89.1 80.0 - 100.0 fL   MCH 30.7 26.0 - 34.0 pg   MCHC 34.4 32.0 - 36.0 g/dL   RDW 40.913.0 81.111.5 - 91.414.5 %   Platelets 210 150 - 440 K/uL  Type and screen [facility]     Status: None   Collection Time: 12/23/16  8:24 AM  Result Value Ref Range   ABO/RH(D) A POS    Antibody Screen NEG    Sample Expiration 12/26/2016   RPR     Status: None   Collection Time: 12/23/16  8:24 AM  Result Value Ref Range   RPR Ser Ql Non Reactive Non Reactive    Comment: (NOTE) Performed At: [facility] [address] [place], [place] [id number] Mila HomerHancock William F MD YQ:[id number]:[phone]   CBC     Status: Abnormal   Collection Time: 12/24/16  7:26 AM  Result Value Ref Range   WBC 21.8 (H) 3.6 - 11.0 K/uL   RBC 4.03 3.80 - 5.20 MIL/uL   Hemoglobin 12.5 12.0 - 16.0 g/dL   HCT 52.836.2 41.335.0 - 24.447.0 %   MCV 89.8 80.0 - 100.0 fL   MCH 30.9 26.0 - 34.0 pg   MCHC 34.4 32.0 - 36.0 g/dL   RDW 01.013.4 27.211.5 - 53.614.5 %   Platelets 169 150 - 440 K/uL    Assessment:   31 y.o. G3P3003 postpartum day #0 TSVD  Plan:    1) Acute blood loss - hemodynamically stable and asymptomatic, no evidence of  anemia  2) Blood Type --/--/A POS (08/12 [id number]) / Rubella Immune (12/27 0000) / Varicella Immune  3) TDAP status - administer prior to discharge  4) Breast/Undecided  5) Febrile to 102.2 during 2nd stage - no fevers since delivery  6) Disposition

## 2016-12-25 MED ORDER — TETANUS-DIPHTH-ACELL PERTUSSIS 5-2.5-18.5 LF-MCG/0.5 IM SUSP
0.5000 mL | INTRAMUSCULAR | Status: AC | PRN
  Administered 2016-12-26: 0.5 mL via INTRAMUSCULAR
  Filled 2016-12-25: qty 0.5

## 2016-12-25 MED ORDER — IBUPROFEN 600 MG PO TABS
600.0000 mg | ORAL_TABLET | Freq: Four times a day (QID) | ORAL | Status: DC
  Administered 2016-12-25 – 2016-12-26 (×4): 600 mg via ORAL
  Filled 2016-12-25 (×5): qty 1

## 2016-12-25 NOTE — Progress Notes (Signed)
Patient ID: [name], female   DOB: Oct 27, 1985, 31 y.o.   MRN: [id number] Obstetric Postpartum Daily Progress Note Subjective:  31 y.o. G3P3003 postpartum day #1 status post vaginal delivery.  She is ambulating, is tolerating po, is voiding spontaneously.  Her pain is well controlled on PO pain medications. Her lochia is less than menses.   Medications SCHEDULED MEDICATIONS  . ferrous sulfate  325 mg Oral BID WC  . ibuprofen  600 mg Oral Q6H  . pantoprazole  40 mg Oral Daily  . prenatal multivitamin  1 tablet Oral Q1200  . senna-docusate  2 tablet Oral Q24H    MEDICATION INFUSIONS    PRN MEDICATIONS  acetaminophen, benzocaine-Menthol, coconut oil, witch hazel-glycerin **AND** dibucaine, diphenhydrAMINE, HYDROcodone-acetaminophen, ondansetron **OR** ondansetron (ZOFRAN) IV, simethicone, sodium chloride flush    Objective:   Vitals:   12/24/16 1214 12/24/16 1637 12/24/16 1947 12/25/16 0800  BP: 104/63 (!) 99/51 100/68 104/61  Pulse: 81 81 77 68  Resp: 16 16 18 18   Temp: 97.8 F (36.6 C) 97.7 F (36.5 C) 98 F (36.7 C) 97.7 F (36.5 C)  TempSrc: Oral Oral Oral Oral  SpO2: 99% 99% 98% 100%  Weight:      Height:        Current Vital Signs 24h Vital Sign Ranges  T 97.7 F (36.5 C) Temp  Avg: 97.8 F (36.6 C)  Min: 97.7 F (36.5 C)  Max: 98 F (36.7 C)  BP 104/61 BP  Min: 99/51  Max: 104/61  HR 68 Pulse  Avg: 76.8  Min: 68  Max: 81  RR 18 Resp  Avg: 17  Min: 16  Max: 18  SaO2 100 % Not Delivered SpO2  Avg: 99 %  Min: 98 %  Max: 100 %       24 Hour I/O Current Shift I/O  Time Ins Outs 08/13 0701 - 08/14 0700 In: -  Out: 600 [Urine:600] No intake/output data recorded.  General: NAD Pulmonary: no increased work of breathing Abdomen: non-distended, non-tender, fundus firm at level of umbilicus Extremities: no edema, no erythema, no tenderness  Labs:   Recent Labs Lab 12/23/16 0824 12/24/16 0726  WBC 11.4* 21.8*  HGB 12.8 12.5  HCT 37.2 36.2  PLT 210 169      Assessment:   30 y.o. G3P3003 postpartum day # 1 status post SVD, doing well  Plan:   1) Acute blood loss - hemodynamically stable and asymptomatic - no evidence of anemia  2) A POS / Rubella Immune (12/27 0000)/ Varicella Immune  3) TDAP status: did not receive in pregnancy.  Vaccinate prior to discharge  4) breast and formula feeding /Contraception = undecided. Considering pill vs depo  5) Disposition: discharge tomorrow  Thomasene MohairStephen Lanard Arguijo, MD 12/25/2016 9:00 AM

## 2016-12-26 MED ORDER — IBUPROFEN 600 MG PO TABS: 600.0000 mg | ORAL_TABLET | Freq: Four times a day (QID) | ORAL | 0 refills | Status: DC | PRN

## 2016-12-26 NOTE — Progress Notes (Signed)
12/26/2016 10:48 AM  BP 105/64 (BP Location: Left Arm)   Pulse 71   Temp 97.7 F (36.5 C) (Oral)   Resp 18   Ht 5\' 7"  (170.2 cm)   Wt 182 lb (82555 g)   LMP 03/16/2016   SpO2 100%   Breastfeeding? Unknown   BMI 28.51 kg/m  Patient discharged per MD orders. Discharge instructions reviewed with patient and patient verbalized understanding.  Prescriptions discussed and given to patient. TDAP vaccination given in right deltoid. Packing up belongings, awaiting discharge.  Ron ParkerHerron, Chandra Asher D, RN

## 2016-12-26 NOTE — Progress Notes (Signed)
Patient discharged via wheelchair escorted by auxilary, infant, and family.

## 2017-01-11 ENCOUNTER — Ambulatory Visit: Payer: Medicaid Other | Admitting: Obstetrics and Gynecology

## 2017-02-06 ENCOUNTER — Ambulatory Visit (INDEPENDENT_AMBULATORY_CARE_PROVIDER_SITE_OTHER): Payer: Medicaid Other | Admitting: Obstetrics and Gynecology

## 2017-02-06 DIAGNOSIS — R87610 Atypical squamous cells of undetermined significance on cytologic smear of cervix (ASC-US): Secondary | ICD-10-CM

## 2017-02-06 MED ORDER — NORGESTIMATE-ETH ESTRADIOL 0.25-35 MG-MCG PO TABS: 1.0000 | ORAL_TABLET | Freq: Every day | ORAL | 4 refills | Status: DC

## 2017-02-06 NOTE — Progress Notes (Signed)
Postpartum Visit   Chief Complaint  Patient presents with  . 6 week post partum   History of Present Illness: Patient is a 31 y.o. [id number] presents for postpartum visit.  Date of delivery: 12/24/2016 Type of delivery: Vaginal delivery - Vacuum or forceps assisted  no Episiotomy No.  Laceration: yes (1st degree) Pregnancy or labor problems:  no Any problems since the delivery:  no  Newborn Details:  SINGLETON :  1. Baby's name: Milana. Birth weight: 9.4lb Maternal Details:  Breast Feeding:  no Post partum depression/anxiety noted:  no [place] Post-Partum Depression Score:  2  Date of last PAP: 05/09/2016/ASCUS HPV+  Past Medical History:  Diagnosis Date  . Vaginal Pap smear, abnormal     Past Surgical History:  Procedure Laterality Date  . TOOTH EXTRACTION      Prior to Admission medications   Medication Sig Start Date End Date Taking? Authorizing Provider  esomeprazole (NEXIUM) 20 MG capsule Take 1 capsule (20 mg total) by mouth daily at 12 noon. 12/07/16   Tresea Mall, CNM  ibuprofen (ADVIL,MOTRIN) 600 MG tablet Take 1 tablet (600 mg total) by mouth every 6 (six) hours as needed. 12/26/16   Farrel Conners, CNM  Prenat w/o A Vit-FeFum-FePo-FA (PROVIDA OB) 20-20-1.25 MG CAPS Take one capsule by mouth daily. 10/24/16   Farrel Conners, CNM   Allergies: No Known Allergies   Social History   Social History  . Marital status: Single    Spouse name: N/A  . Number of children: N/A  . Years of education: N/A   Occupational History  . Not on file.   Social History Main Topics  . Smoking status: Former Smoker    Packs/day: 0.00    Years: 0.00    Quit date: 05/10/2014  . Smokeless tobacco: Never Used  . Alcohol use No  . Drug use: No  . Sexual activity: Yes   Other Topics Concern  . Not on file   Social History Narrative  . No narrative on file   Family History: denies history of gynecologic cancer  Review of Systems  Constitutional: Negative.     HENT: Negative.   Eyes: Negative.   Respiratory: Negative.   Cardiovascular: Negative.   Gastrointestinal: Negative.   Genitourinary: Negative.   Musculoskeletal: Negative.   Skin: Negative.   Neurological: Negative.   Psychiatric/Behavioral: Negative.      Physical Exam BP 114/70   Ht  (1.702 m)   Wt 152 lb (68.9 kg)   LMP 01/30/2017   BMI 23.81 kg/m   Physical Exam  Constitutional: She is oriented to person, place, and time. She appears well-developed and well-nourished. No distress.  Genitourinary: Vagina normal and uterus normal. Pelvic exam was performed with patient supine. There is no rash, tenderness or lesion on the right labia. There is no rash, tenderness or lesion on the left labia. Vagina exhibits no lesion. No erythema, tenderness or bleeding in the vagina. No foreign body in the vagina. No signs of injury around the vagina. Right adnexum does not display mass, does not display tenderness and does not display fullness. Left adnexum does not display mass, does not display tenderness and does not display fullness. Cervix does not exhibit motion tenderness, lesion, discharge or polyp.   Uterus is mobile and retroverted. Uterus is not enlarged, tender, exhibiting a mass or irregular (is regular).  Eyes: EOM are normal. No scleral icterus.  Neck: Normal range of motion. Neck supple.  Cardiovascular: Normal rate and regular rhythm.  Pulmonary/Chest: Effort normal and breath sounds normal. No respiratory distress. She has no wheezes. She has no rales.  Abdominal: Soft. Bowel sounds are normal. She exhibits no distension and no mass. There is no tenderness. There is no rebound and no guarding.  Musculoskeletal: Normal range of motion. She exhibits no edema.  Neurological: She is alert and oriented to person, place, and time. No cranial nerve deficit.  Skin: Skin is warm and dry. No erythema.  Psychiatric: She has a normal mood and affect. Her behavior is normal. Judgment  normal.     Female Chaperone present during breast and/or pelvic exam.  Assessment: 31 y.o. Z6X0960 presenting for 6 week postpartum visit  Plan: Problem List Items Addressed This Visit    Abnormal Pap smear of cervix   Relevant Orders   IGP, Aptima HPV, rfx 16/18,45   Postpartum care following vaginal delivery - Primary   Relevant Medications   norgestimate-ethinyl estradiol (ORTHO-CYCLEN,SPRINTEC,PREVIFEM) 0.25-35 MG-MCG tablet   Other Relevant Orders   IGP, Aptima HPV, rfx 16/18,45     1) Contraception Education given regarding options for contraception, including barrier methods, injectable contraception, IUD placement, oral contraceptives. Patient would like oral OCPs. No contraindications. She voiced understanding regarding VTE risk.  2)  Pap - ASCCP guidelines and rational discussed.  Pap obtained today due to recent high-risk pap smear result history.  3) Patient underwent screening for postpartum depression with no concerns noted.  4) Follow up 1 year for routine annual exam or prn abnormal pap smear  Thomasene Mohair, MD 02/06/2017 10:03 AM

## 2017-02-08 LAB — IGP, APTIMA HPV, RFX 16/18,45
HPV Aptima: POSITIVE — AB
PAP SMEAR COMMENT: 0

## 2017-02-15 ENCOUNTER — Telehealth: Payer: Self-pay | Admitting: Obstetrics and Gynecology

## 2017-02-15 NOTE — Telephone Encounter (Signed)
Discussed findings of LGSIL,HPV+ pap smear.  Recommended colposcopy and discussed importance of follow up with colposcopy.  Discussed risk of progression to cervical cancer, if colposcopy and other cancer prevention measures aren't taken. She voiced understanding. She will call to schedule colposcopy.

## 2017-02-15 NOTE — Telephone Encounter (Signed)
Left generic VM. She will need colposcopy.

## 2017-02-15 NOTE — Telephone Encounter (Signed)
Pt called back, SDJ aware, fwding back to him for documentation.

## 2017-11-27 ENCOUNTER — Ambulatory Visit (INDEPENDENT_AMBULATORY_CARE_PROVIDER_SITE_OTHER): Payer: Medicaid Other | Admitting: Obstetrics and Gynecology

## 2017-11-27 ENCOUNTER — Encounter: Payer: Self-pay | Admitting: Obstetrics and Gynecology

## 2017-11-27 ENCOUNTER — Other Ambulatory Visit (HOSPITAL_COMMUNITY)
Admission: RE | Admit: 2017-11-27 | Discharge: 2017-11-27 | Disposition: A | Discharge status: Home / Self Care | Payer: Medicaid Other | Source: Ambulatory Visit | Attending: Obstetrics and Gynecology | Admitting: Obstetrics and Gynecology

## 2017-11-27 VITALS — BP 126/56 | Wt 141.0 lb

## 2017-11-27 DIAGNOSIS — Z3A01 Less than 8 weeks gestation of pregnancy: Secondary | ICD-10-CM | POA: Diagnosis not present

## 2017-11-27 DIAGNOSIS — Z3689 Encounter for other specified antenatal screening: Secondary | ICD-10-CM

## 2017-11-27 DIAGNOSIS — Z113 Encounter for screening for infections with a predominantly sexual mode of transmission: Secondary | ICD-10-CM | POA: Insufficient documentation

## 2017-11-27 DIAGNOSIS — Z124 Encounter for screening for malignant neoplasm of cervix: Secondary | ICD-10-CM | POA: Diagnosis present

## 2017-11-27 DIAGNOSIS — Z3480 Encounter for supervision of other normal pregnancy, unspecified trimester: Secondary | ICD-10-CM

## 2017-11-27 DIAGNOSIS — Z3481 Encounter for supervision of other normal pregnancy, first trimester: Secondary | ICD-10-CM | POA: Diagnosis not present

## 2017-11-27 NOTE — Progress Notes (Signed)
New Obstetric Patient H&P    Chief Complaint: "Desires prenatal care"   History of Present Illness: Patient is a 32 y.o. [phone] Not Hispanic or Latino female, presents with amenorrhea and positive home pregnancy test. Patient's last menstrual period was 10/20/2017 (approximate). and based on her  LMP, her Estimated Date of Delivery: 07/27/18 and [redacted]w[redacted]d.   Cycles are regular monthly. Her last pap smear was 02/06/2017 and LGSIL HPV positive.  She has not had a follow up colposcopy.    Her last menstrual period was normal in timing and length.. Since her LMP she claims she has experienced fatigue, breast tenderness, some dyspnea.. She denies vaginal bleeding. Her past medical history is noncontributory. Her prior pregnancies are notable for none  Since her LMP, she admits to the use of tobacco products  no She claims weight has been stable There are cats in the home in the home  no If yes N/A She admits close contact with children on a regular basis  yes  She has had chicken pox in the past yes She has had Tuberculosis exposures, symptoms, or previously tested positive for TB   no Current or past history of domestic violence. no  Genetic Screening/Teratology Counseling: (Includes patient, baby's father, or anyone in either family with:)   1. Patient's age >/= 71 at [facility]  no 2. Thalassemia ([place], [place], Mediterranean, or Asian background): MCV<80  no 3. Neural tube defect (meningomyelocele, spina bifida, anencephaly)  no 4. Congenital heart defect  no  5. Down syndrome  no 6. Tay-Sachs (Jewish, [place]  no 7. Canavan's Disease  no 8. Sickle cell disease or trait (African)  no  9. Hemophilia or other blood disorders  no  10. Muscular dystrophy  no  11. Cystic fibrosis  no  12. Huntington's Chorea  no  13. Mental retardation/autism  no 14. Other inherited genetic or chromosomal disorder  no 15. Maternal metabolic disorder (DM, PKU, etc)  no 16. Patient or FOB with a child  with a birth defect not listed above no  16a. Patient or FOB with a birth defect themselves no 17. Recurrent pregnancy loss, or stillbirth  no  18. Any medications since LMP other than prenatal vitamins (include vitamins, supplements, OTC meds, drugs, alcohol)  no 19. Any other genetic/environmental exposure to discuss  not applicable  Infection History:   1. Lives with someone with TB or TB exposed  no  2. Patient or partner has history of genital herpes  no 3. Rash or viral illness since LMP  no 4. History of STI (GC, CT, HPV, syphilis, HIV)  Yes, remote history CT 5. History of recent travel :  no  Other pertinent information:  no     Review of Systems:10 point review of systems negative unless otherwise noted in HPI  Past Medical History:  Past Medical History:  Diagnosis Date  . Vaginal Pap smear, abnormal     Past Surgical History:  Past Surgical History:  Procedure Laterality Date  . TOOTH EXTRACTION      Gynecologic History: Patient's last menstrual period was 10/20/2017 (approximate).  Obstetric History: [id number]  Family History:  History reviewed. No pertinent family history.  Social History:  Social History   Socioeconomic History  . Marital status: Significant Other    Spouse name: Not on file  . Number of children: Not on file  . Years of education: Not on file  . Highest education level: Not on file  Occupational History  . Not  on file  Social Needs  . Financial resource strain: Not on file  . Food insecurity:    Worry: Not on file    Inability: Not on file  . Transportation needs:    Medical: Not on file    Non-medical: Not on file  Tobacco Use  . Smoking status: Former Smoker    Packs/day: 0.00    Years: 0.00    Pack years: 0.00    Last attempt to quit: 05/10/2014    Years since quitting: 3.5  . Smokeless tobacco: Never Used  Substance and Sexual Activity  . Alcohol use: No  . Drug use: No  . Sexual activity: Yes  Lifestyle  .  Physical activity:    Days per week: Not on file    Minutes per session: Not on file  . Stress: Not on file  Relationships  . Social connections:    Talks on phone: Not on file    Gets together: Not on file    Attends religious service: Not on file    Active member of club or organization: Not on file    Attends meetings of clubs or organizations: Not on file    Relationship status: Not on file  . Intimate partner violence:    Fear of current or ex partner: Not on file    Emotionally abused: Not on file    Physically abused: Not on file    Forced sexual activity: Not on file  Other Topics Concern  . Not on file  Social History Narrative  . Not on file    Allergies:  No Known Allergies  Medications: Prior to Admission medications   Not on File    Physical Exam Vitals: Blood pressure (!) 126/56, weight 141 lb (64 kg), last menstrual period 10/20/2017, unknown if currently breastfeeding.  General: NAD HEENT: normocephalic, anicteric Thyroid: no enlargement, no palpable nodules Pulmonary: No increased work of breathing, CTAB Cardiovascular: RRR, distal pulses 2+ Abdomen: NABS, soft, non-tender, non-distended.  Umbilicus without lesions.  No hepatomegaly, splenomegaly or masses palpable. No evidence of hernia  Genitourinary:  External: Normal external female genitalia.  Normal urethral meatus, normal  Bartholin's and Skene's glands.    Vagina: Normal vaginal mucosa, no evidence of prolapse.    Cervix: Grossly normal in appearance, no bleeding  Uterus:  Non-enlarged, mobile, normal contour.  No CMT  Adnexa: ovaries non-enlarged, no adnexal masses  Rectal: deferred Extremities: no edema, erythema, or tenderness Neurologic: Grossly intact Psychiatric: mood appropriate, affect full   Assessment: 32 y.o. G4P3003 at Unknown presenting to initiate prenatal care  Problem List Items Addressed This Visit    None    Visit Diagnoses    Cervical cancer screening    -  Primary     Relevant Orders   Cytology - PAP   Screen for STD (sexually transmitted disease)       Relevant Orders   RPR+Rh+ABO+Rub Ab+Ab Scr+CB...   Cytology - PAP   Supervision of other normal pregnancy, antepartum       Relevant Orders   RPR+Rh+ABO+Rub Ab+Ab Scr+CB.Marland KitchenMarland Kitchen   Urine Culture   US OB Comp Less 14 Wks   Establish gestational age, ultrasound       Relevant Orders   US OB Comp Less 14 Wks      Plan: 1) Avoid alcoholic beverages. 2) Patient encouraged not to smoke.  3) Discontinue the use of all non-medicinal drugs and chemicals.  4) Take prenatal vitamins daily.  5) Nutrition, food safety (  fish, cheese advisories, and high nitrite foods) and exercise discussed. 6) Hospital and practice style discussed with cross coverage system.  7) Genetic Screening, such as with 1st Trimester Screening, cell free fetal DNA, AFP testing, and Ultrasound, as well as with amniocentesis and CVS as appropriate, is discussed with patient. At the conclusion of today's visit patient undecided genetic testing 8) Patient is asked about travel to areas at risk for the [place] virus, and counseled to avoid travel and exposure to mosquitoes or sexual partners who may have themselves been exposed to the virus. Testing is discussed, and will be ordered as appropriate.   Vena AustriaAndreas Adian Jablonowski, MD, Evern CoreFACOG [facility], [facility] 11/27/2017, 3:45 PM

## 2017-11-29 LAB — URINE CULTURE

## 2017-12-02 LAB — CYTOLOGY - PAP
CHLAMYDIA, DNA PROBE: NEGATIVE
HPV (WINDOPATH): DETECTED — AB
HPV 16/18/45 GENOTYPING: NEGATIVE
Neisseria Gonorrhea: NEGATIVE

## 2017-12-03 ENCOUNTER — Ambulatory Visit (INDEPENDENT_AMBULATORY_CARE_PROVIDER_SITE_OTHER): Payer: Medicaid Other

## 2017-12-03 ENCOUNTER — Other Ambulatory Visit: Payer: Self-pay | Admitting: Obstetrics and Gynecology

## 2017-12-03 ENCOUNTER — Ambulatory Visit (INDEPENDENT_AMBULATORY_CARE_PROVIDER_SITE_OTHER): Payer: Medicaid Other | Admitting: Obstetrics and Gynecology

## 2017-12-03 VITALS — BP 106/62 | Wt 144.0 lb

## 2017-12-03 DIAGNOSIS — Z3A01 Less than 8 weeks gestation of pregnancy: Secondary | ICD-10-CM

## 2017-12-03 DIAGNOSIS — O30041 Twin pregnancy, dichorionic/diamniotic, first trimester: Secondary | ICD-10-CM

## 2017-12-03 DIAGNOSIS — O30042 Twin pregnancy, dichorionic/diamniotic, second trimester: Secondary | ICD-10-CM | POA: Insufficient documentation

## 2017-12-03 DIAGNOSIS — Z3689 Encounter for other specified antenatal screening: Secondary | ICD-10-CM

## 2017-12-03 DIAGNOSIS — O30049 Twin pregnancy, dichorionic/diamniotic, unspecified trimester: Secondary | ICD-10-CM

## 2017-12-03 DIAGNOSIS — Z3480 Encounter for supervision of other normal pregnancy, unspecified trimester: Secondary | ICD-10-CM

## 2017-12-03 DIAGNOSIS — O09899 Supervision of other high risk pregnancies, unspecified trimester: Secondary | ICD-10-CM | POA: Insufficient documentation

## 2017-12-03 DIAGNOSIS — O0990 Supervision of high risk pregnancy, unspecified, unspecified trimester: Secondary | ICD-10-CM

## 2017-12-03 MED ORDER — FOLIC ACID 1 MG PO TABS
1.0000 mg | ORAL_TABLET | Freq: Every day | ORAL | 10 refills | Status: DC
Start: 2017-12-03 — End: 2018-02-25

## 2017-12-03 NOTE — Progress Notes (Signed)
Routine Prenatal Care Visit  Subjective  Margaret [name] is a 32 y.o. [phone] at 6029w2d being seen today for ongoing prenatal care.  She is currently monitored for the following issues for this high-risk pregnancy and has Lump or mass in breast; Supervision of high risk pregnancy, antepartum; Abnormal Pap smear of cervix; Dichorionic diamniotic twin pregnancy, antepartum; and Short interval between pregnancies complicating pregnancy, antepartum on their problem list.  ----------------------------------------------------------------------------------- Patient reports no complaints.   Contractions: Not present. Vag. Bleeding: None.  Movement: Absent. Denies leaking of fluid.  ----------------------------------------------------------------------------------- The following portions of the patient's history were reviewed and updated as appropriate: allergies, current medications, past family history, past medical history, past social history, past surgical history and problem list. Problem list updated.   Objective  Blood pressure 106/62, weight 144 lb (65.3 kg), last menstrual period 10/20/2017, unknown if currently breastfeeding. Pregravid weight 135 lb (61.2 kg) Total Weight Gain 9 lb (4.082 kg) Urinalysis: Urine Protein: Trace Urine Glucose: Negative  Fetal Status:     Movement: Absent     General:  Alert, oriented and cooperative. Patient is in no acute distress.  Skin: Skin is warm and dry. No rash noted.   Cardiovascular: Normal heart rate noted  Respiratory: Normal respiratory effort, no problems with respiration noted  Abdomen: Soft, gravid, appropriate for gestational age. Pain/Pressure: Absent     Pelvic:  Cervical exam deferred        Extremities: Normal range of motion.     ental Status: Normal mood and affect. Normal behavior. Normal judgment and thought content.     Assessment   32 y.o. G4P3003 at 4929w2d by  07/27/2018, by Last Menstrual Period presenting for routine  prenatal visit  Plan   Pregnancy#4 Problems (from 10/20/17 to present)    Problem Noted Resolved   Dichorionic diamniotic twin pregnancy, antepartum 12/03/2017 by Vena AustriaStaebler, Jhene Westmoreland, MD No   Overview Signed 12/03/2017  2:16 PM by Vena AustriaStaebler, Ladell Bey, MD    [X]  additional 1g folic acid  [X]  baseline preeclampsia labs [ ]  ASA after 1st trimester      Short interval between pregnancies complicating pregnancy, antepartum 12/03/2017 by Vena AustriaStaebler, Jerardo Costabile, MD No   Supervision of high risk pregnancy, antepartum 08/13/2016 by Farrel ConnersGutierrez, Colleen, CNM No   Overview Addendum 12/03/2017  2:12 PM by Vena AustriaStaebler, Nicholai Willette, MD     [facility] Prenatal Labs  Dating LMP = 6 week US Di/Di twin pregnancy Blood type: A/Positive/-- (12/27 0000)   Genetic Screen 1 Screen:  neg  AFP:     Quad:     NIPS: Antibody:Negative (12/27 0000)  Anatomic US  Rubella: Immune (12/27 0000)  GTT Early:               Third trimester: 74 RPR: Nonreactive (12/27 0000)   Flu vaccine  HBsAg: Negative (12/27 0000)   TDaP vaccine                                               Rhogam: HIV: Non-reactive (12/27 0000)   Baby Food      Breast                                         GBS: (For PCN allergy, check sensitivities)  Contraception  Pap: 05/09/16 ASCUS with +HRHPV  Circumcision  Varicella immune  Pediatrician    Support Person            Abnormal Pap smear of cervix 08/13/2016 by Farrel Conners, CNM No   Overview Addendum 12/03/2017  2:21 PM by Vena Austria, MD    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  11/28/2015 LGSIL HPV positive 16/18 neg / neg [ ]  colpo outside of first trimester          Gestational age appropriate obstetric precautions including but not limited to vaginal bleeding, contractions, leaking of fluid and fetal movement were reviewed in detail with the patient.   Start low dose ASA at >[redacted] weeks gestation as per USPTF recommendation "Low-Dose Aspirin Use for the Prevention of  Morbidity and Mortality From Preeclampsia: Preventive Medicine"  furthermore endorsed by ACOG, WHO, and NIH based on evidence level B for the prevention of preeclampsia  In women deemed high risk  (diabetes, renal disease, chronic hypertension, history of preeclampsia in prior gestation, autoimmune diseases, or multifetal gestations).  ACOG Committee Opinion 743 "Low-Dose Asprin Use During Pregnancy" June 25th 2018  High Risk (Start if 1 or more present) History of preeclampsia Multifetal Gestation Chronic HTN Type I or II DM Renal Disease Autoimmune Disease (SLE, Antiphospholipid antibody syndrome)  Moderate Risk (consider starting if more than one present) Nulliparity Obesity (BMI >30) Family history of Preeclampsia (Mother or sister) Socioeconomic characteristics (African American, low socieeconomic status) Age 12 years or older Personal history factors (low birthweight of SGA, previous adverse pregnancy outcome, more than 10 year pregnancy interval)   - Baseline preeclampsia labs with NOB labs today - 1g additional folic acid on top of prenatals called in  Return in about 1 month (around 12/31/2017) for ROB.  Vena [place], MD, Evern Core [facility], [facility] 12/03/2017, 2:22 PM

## 2017-12-03 NOTE — Progress Notes (Signed)
ROB Dating U/S 

## 2017-12-04 LAB — RPR+RH+ABO+RUB AB+AB SCR+CB...
Antibody Screen: NEGATIVE
HIV Screen 4th Generation wRfx: NONREACTIVE
Hematocrit: 39.9 % (ref 34.0–46.6)
Hemoglobin: 13.2 g/dL (ref 11.1–15.9)
Hepatitis B Surface Ag: NEGATIVE
MCH: 30 pg (ref 26.6–33.0)
MCHC: 33.1 g/dL (ref 31.5–35.7)
MCV: 91 fL (ref 79–97)
PLATELETS: 242 10*3/uL (ref 150–450)
RBC: 4.4 x10E6/uL (ref 3.77–5.28)
RDW: 12.5 % (ref 12.3–15.4)
RPR Ser Ql: NONREACTIVE
RUBELLA: 4.18 {index} (ref >0.99)
Rh Factor: POSITIVE
VARICELLA: 497 {index} (ref >165)
WBC: 11.1 10*3/uL — ABNORMAL HIGH (ref 3.4–10.8)

## 2017-12-04 LAB — PROTEIN / CREATININE RATIO, URINE
Creatinine, Urine: 134 mg/dL
PROTEIN/CREAT RATIO: 119 mg/g{creat} (ref 0–200)
Protein, Ur: 16 mg/dL

## 2017-12-04 LAB — COMPREHENSIVE METABOLIC PANEL
A/G RATIO: 2.3 — AB (ref 1.2–2.2)
ALT: 13 IU/L (ref 0–32)
AST: 10 IU/L (ref 0–40)
Albumin: 4.6 g/dL (ref 3.5–5.5)
Alkaline Phosphatase: 38 IU/L — ABNORMAL LOW (ref 39–117)
BUN/Creatinine Ratio: 21 (ref 9–23)
BUN: 14 mg/dL (ref 6–20)
Bilirubin Total: 0.3 mg/dL (ref 0.0–1.2)
CALCIUM: 9.6 mg/dL (ref 8.7–10.2)
CO2: 23 mmol/L (ref 20–29)
CREATININE: 0.68 mg/dL (ref 0.57–1.00)
Chloride: 104 mmol/L (ref 96–106)
GFR, EST AFRICAN AMERICAN: 137 mL/min/{1.73_m2} (ref >59)
GFR, EST NON AFRICAN AMERICAN: 119 mL/min/{1.73_m2} (ref >59)
GLOBULIN, TOTAL: 2 g/dL (ref 1.5–4.5)
Glucose: 64 mg/dL — ABNORMAL LOW (ref 65–99)
POTASSIUM: 4.7 mmol/L (ref 3.5–5.2)
SODIUM: 141 mmol/L (ref 134–144)
TOTAL PROTEIN: 6.6 g/dL (ref 6.0–8.5)

## 2017-12-06 ENCOUNTER — Telehealth: Payer: Self-pay | Admitting: Obstetrics and Gynecology

## 2017-12-06 NOTE — Telephone Encounter (Signed)
Patient is calling for labs results. Please advise. 

## 2017-12-09 ENCOUNTER — Telehealth: Payer: Self-pay

## 2017-12-09 ENCOUNTER — Other Ambulatory Visit: Payer: Self-pay | Admitting: Obstetrics and Gynecology

## 2017-12-09 MED ORDER — DOXYLAMINE-PYRIDOXINE 10-10 MG PO TBEC: 2.0000 | DELAYED_RELEASE_TABLET | Freq: Every day | ORAL | 5 refills | Status: DC

## 2017-12-09 MED ORDER — PROMETHAZINE HCL 12.5 MG PO TABS: 12.5000 mg | ORAL_TABLET | Freq: Four times a day (QID) | ORAL | 0 refills | Status: DC | PRN

## 2017-12-09 NOTE — Telephone Encounter (Signed)
Patient is notified.

## 2017-12-09 NOTE — Telephone Encounter (Signed)
All the routine prenatals look fine

## 2017-12-09 NOTE — Telephone Encounter (Signed)
Please advise 

## 2017-12-09 NOTE — Telephone Encounter (Signed)
Pt called triage line states she is currently seeing AMS, she's having really bad sickness and would like some nausea medication sent in . [phone]

## 2017-12-09 NOTE — Telephone Encounter (Signed)
Tried to call patient and we were disconnected. Huntley DecSara please let her know all labs were normal per AMS. Thanks

## 2017-12-27 ENCOUNTER — Telehealth: Payer: Self-pay

## 2017-12-27 NOTE — Telephone Encounter (Signed)
Spoke w/pt. Notified we can fax a form to the oral surgeon. Pt requests it to be sent to [name] cb# [phone] Fax# [phone] Form completed/faxed

## 2017-12-27 NOTE — Telephone Encounter (Signed)
Pt calling for OB Clearance. She needs to get her wisdom tooth pulled. It's giving her problems. ZO#[phone]#[phone].

## 2017-12-31 ENCOUNTER — Ambulatory Visit (INDEPENDENT_AMBULATORY_CARE_PROVIDER_SITE_OTHER): Payer: Medicaid Other | Admitting: Obstetrics and Gynecology

## 2017-12-31 VITALS — BP 128/56 | Wt 148.0 lb

## 2017-12-31 DIAGNOSIS — O0990 Supervision of high risk pregnancy, unspecified, unspecified trimester: Secondary | ICD-10-CM

## 2017-12-31 DIAGNOSIS — O30049 Twin pregnancy, dichorionic/diamniotic, unspecified trimester: Secondary | ICD-10-CM

## 2017-12-31 DIAGNOSIS — Z3A10 10 weeks gestation of pregnancy: Secondary | ICD-10-CM

## 2017-12-31 DIAGNOSIS — Z1379 Encounter for other screening for genetic and chromosomal anomalies: Secondary | ICD-10-CM

## 2017-12-31 DIAGNOSIS — O30041 Twin pregnancy, dichorionic/diamniotic, first trimester: Secondary | ICD-10-CM

## 2017-12-31 DIAGNOSIS — Z31438 Encounter for other genetic testing of female for procreative management: Secondary | ICD-10-CM

## 2017-12-31 DIAGNOSIS — O09899 Supervision of other high risk pregnancies, unspecified trimester: Secondary | ICD-10-CM

## 2017-12-31 LAB — POCT URINALYSIS DIPSTICK OB
GLUCOSE, UA: NEGATIVE — AB
POC,PROTEIN,UA: NEGATIVE

## 2017-12-31 MED ORDER — ONDANSETRON 4 MG PO TBDP: 4.0000 mg | ORAL_TABLET | Freq: Four times a day (QID) | ORAL | 0 refills | Status: DC | PRN

## 2017-12-31 NOTE — Progress Notes (Signed)
Routine Prenatal Care Visit  Subjective  Margaret BasemanHeather M [name] is a 32 y.o. G4P3003 at 4022w2d being seen today for ongoing prenatal care.  She is currently monitored for the following issues for this high-risk pregnancy and has Lump or mass in breast; Supervision of high risk pregnancy, antepartum; Abnormal Pap smear of cervix; Dichorionic diamniotic twin pregnancy, antepartum; and Short interval between pregnancies complicating pregnancy, antepartum on their problem list.  ----------------------------------------------------------------------------------- Patient reports dizzyness and nausea.   Contractions: Not present. Vag. Bleeding: None.  Movement: Absent. Denies leaking of fluid.  ----------------------------------------------------------------------------------- The following portions of the patient's history were reviewed and updated as appropriate: allergies, current medications, past family history, past medical history, past social history, past surgical history and problem list. Problem list updated.   Objective  Blood pressure (!) 128/56, weight 148 lb (67.1 kg), last menstrual period 10/20/2017, unknown if currently breastfeeding. Pregravid weight 135 lb (61.2 kg) Total Weight Gain 13 lb (5.897 kg) Urinalysis:      Fetal Status: Fetal Heart Rate (bpm): 169   Movement: Absent     General:  Alert, oriented and cooperative. Patient is in no acute distress.  Skin: Skin is warm and dry. No rash noted.   Cardiovascular: Normal heart rate noted  Respiratory: Normal respiratory effort, no problems with respiration noted  Abdomen: Soft, gravid, appropriate for gestational age. Pain/Pressure: Absent     Pelvic:  Cervical exam deferred        Extremities: Normal range of motion.     ental Status: Normal mood and affect. Normal behavior. Normal judgment and thought content.     Assessment   32 y.o. G4P3003 at 5822w2d by  07/27/2018, by Last Menstrual Period presenting for routine  prenatal visit  Plan   Pregnancy#4 Problems (from 10/20/17 to present)    Problem Noted Resolved   Dichorionic diamniotic twin pregnancy, antepartum 12/03/2017 by Vena AustriaStaebler, Vinita Prentiss, MD No   Overview Signed 12/03/2017  2:16 PM by Vena AustriaStaebler, Aminata Buffalo, MD    [X]  additional 1g folic acid  [X]  baseline preeclampsia labs [ ]  ASA after 1st trimester      Short interval between pregnancies complicating pregnancy, antepartum 12/03/2017 by Vena AustriaStaebler, Agapita Savarino, MD No   Supervision of high risk pregnancy, antepartum 08/13/2016 by Farrel ConnersGutierrez, Colleen, CNM No   Overview Addendum 12/10/2017  6:12 PM by Vena AustriaStaebler, Geovanna Simko, MD     [facility] Prenatal Labs  Dating LMP = 6 week US Di/Di twin pregnancy Blood type: A/Positive/-- (12/27 0000)   Genetic Screen 1 Screen:  neg  AFP:     Quad:     NIPS: Antibody:Negative (12/27 0000)  Anatomic US  Rubella: Immune (12/27 0000)  GTT Early:               Third trimester: 74 RPR: Nonreactive (12/27 0000)   Flu vaccine  HBsAg: Negative (12/27 0000)   TDaP vaccine                                               Rhogam: N/A HIV: Non-reactive (12/27 0000)   Baby Food      Breast                                         GBS: (For PCN allergy,  check sensitivities)  Contraception  Pap: 05/09/16 ASCUS with +HRHPV  Circumcision  Varicella immune  Pediatrician    Support Person            Abnormal Pap smear of cervix 08/13/2016 by Farrel ConnersGutierrez, Colleen, CNM No   Overview Addendum 12/04/2017  1:30 PM by Vena AustriaStaebler, Jestin Burbach, MD    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  11/27/2017 LGSIL HPV positive 16/18 neg / neg [ ]  colpo outside of first trimester          Gestational age appropriate obstetric precautions including but not limited to vaginal bleeding, contractions, leaking of fluid and fetal movement were reviewed in detail with the patient.   - inheritest and maternity 21 today - FHT 169 and 169 by ultrasound - TSH for dizzyness but suspect  dehydration - Zofran added to antiemetic regimen - Discussed single study showing increased risk of cleft lip cleft palate and heart defects, subsequent studies and larger studies out of [place] and [place] have failed to show any consistent association between Zofran exposure and these birth defects.  It is also important to note that Zofran exposed pregnancies and non-exposed pregnancies likely differ in the degree of nausea these two groups experience.  Folate acid metabolism which is linked to clefting and heart defects is a more likely pathway to causing these.  It stand to reason that patients with worse nausea are less consistent with taking PNV and therefore have lower folic acid levels.    Return in about 4 weeks (around 01/28/2018) for ROB.  Vena AustriaAndreas Chantel Teti, MD, Evern CoreFACOG [facility], [facility] 12/31/2017, 3:28 PM

## 2017-12-31 NOTE — Progress Notes (Signed)
ROB Dizziness/light headed/blurred

## 2018-01-01 LAB — TSH: TSH: <0.006 u[IU]/mL — ABNORMAL LOW (ref 0.450–4.500)

## 2018-01-06 ENCOUNTER — Telehealth: Payer: Self-pay | Admitting: Obstetrics and Gynecology

## 2018-01-06 LAB — MATERNIT 21 PLUS CORE, BLOOD
CHROMOSOME 18: NEGATIVE
CHROMOSOME 21: NEGATIVE
Chromosome 13: NEGATIVE
Y Chromosome: NOT DETECTED

## 2018-01-06 NOTE — Telephone Encounter (Signed)
Please call and let her know she has MyChart and she can print them from there. Thanks

## 2018-01-06 NOTE — Telephone Encounter (Signed)
Patient asking if genetic results could be printed out for her to pick up.

## 2018-01-06 NOTE — Telephone Encounter (Signed)
Patient is calling to find out her lab results. Please call and leave detail message

## 2018-01-09 NOTE — Telephone Encounter (Signed)
Patient is calling due to missed Call. Please advise

## 2018-01-09 NOTE — Telephone Encounter (Signed)
Disregard I called her by mistake

## 2018-01-14 LAB — INHERITEST CORE(CF97,SMA,FRAX)

## 2018-01-28 ENCOUNTER — Ambulatory Visit (INDEPENDENT_AMBULATORY_CARE_PROVIDER_SITE_OTHER): Payer: Medicaid Other | Admitting: Obstetrics and Gynecology

## 2018-01-28 ENCOUNTER — Encounter: Payer: Self-pay | Admitting: Obstetrics and Gynecology

## 2018-01-28 VITALS — BP 112/50 | Wt 154.0 lb

## 2018-01-28 DIAGNOSIS — O0990 Supervision of high risk pregnancy, unspecified, unspecified trimester: Secondary | ICD-10-CM

## 2018-01-28 DIAGNOSIS — Z3A14 14 weeks gestation of pregnancy: Secondary | ICD-10-CM

## 2018-01-28 DIAGNOSIS — R87610 Atypical squamous cells of undetermined significance on cytologic smear of cervix (ASC-US): Secondary | ICD-10-CM

## 2018-01-28 DIAGNOSIS — O30049 Twin pregnancy, dichorionic/diamniotic, unspecified trimester: Secondary | ICD-10-CM

## 2018-01-28 DIAGNOSIS — Z23 Encounter for immunization: Secondary | ICD-10-CM

## 2018-01-28 DIAGNOSIS — R87629 Unspecified abnormal cytological findings in specimens from vagina: Secondary | ICD-10-CM | POA: Insufficient documentation

## 2018-01-28 DIAGNOSIS — O09899 Supervision of other high risk pregnancies, unspecified trimester: Secondary | ICD-10-CM

## 2018-01-28 LAB — POCT URINALYSIS DIPSTICK OB
Glucose, UA: NEGATIVE
POC,PROTEIN,UA: NEGATIVE

## 2018-01-28 NOTE — Progress Notes (Signed)
Routine Prenatal Care Visit  Subjective  Margaret BasemanHeather M [name] is a 32 y.o. G4P3003 at 6368w2d being seen today for ongoing prenatal care.  She is currently monitored for the following issues for this high-risk pregnancy and has Lump or mass in breast; Supervision of high risk pregnancy, antepartum; Abnormal Pap smear of cervix; Dichorionic diamniotic twin pregnancy, antepartum; and Short interval between pregnancies complicating pregnancy, antepartum on their problem list.  ----------------------------------------------------------------------------------- Patient reports no complaints.   Contractions: Not present. Vag. Bleeding: None.  Movement: Absent. Denies leaking of fluid.  ----------------------------------------------------------------------------------- The following portions of the patient's history were reviewed and updated as appropriate: allergies, current medications, past family history, past medical history, past social history, past surgical history and problem list. Problem list updated.   Objective  Blood pressure (!) 112/50, weight 154 lb (69.9 kg), last menstrual period 10/20/2017, unknown if currently breastfeeding. Pregravid weight 135 lb (61.2 kg) Total Weight Gain 19 lb (8.618 kg) Urinalysis:      Fetal Status: Fetal Heart Rate (bpm): 142   Movement: Absent     General:  Alert, oriented and cooperative. Patient is in no acute distress.  Skin: Skin is warm and dry. No rash noted.   Cardiovascular: Normal heart rate noted  Respiratory: Normal respiratory effort, no problems with respiration noted  Abdomen: Soft, gravid, appropriate for gestational age. Pain/Pressure: Absent     Pelvic:  Cervical exam deferred        Extremities: Normal range of motion.     ental Status: Normal mood and affect. Normal behavior. Normal judgment and thought content.     Assessment   32 y.o. [id number] at 6368w2d by  07/27/2018, by Last Menstrual Period presenting for routine prenatal  visit  Plan   Pregnancy#4 Problems (from 10/20/17 to 01/28/18)    Problem Noted Resolved   Dichorionic diamniotic twin pregnancy, antepartum 12/03/2017 by Vena AustriaStaebler, Syrina Wake, MD No   Overview Addendum 01/28/2018  2:43 PM by Vena AustriaStaebler, Blenda Wisecup, MD    [X]  additional 1g folic acid  [X]  baseline preeclampsia labs [ ]  ASA after 1st trimester [ ]  Monthly growth scan [ ]  Delivery planning      Short interval between pregnancies complicating pregnancy, antepartum 12/03/2017 by Vena AustriaStaebler, Milcah Dulany, MD No   Supervision of high risk pregnancy, antepartum 08/13/2016 by Farrel ConnersGutierrez, Colleen, CNM No   Overview Addendum 01/28/2018  2:47 PM by Vena AustriaStaebler, Laporsche Hoeger, MD     [facility] Prenatal Labs  Dating LMP = 6 week US Di/Di twin pregnancy Blood type: A/Positive/-- (12/27 0000)   Genetic Screen NIPS: Normal XX SMA neg, CF, neg, and Fragile-X neg Antibody:Negative (12/27 0000)  Anatomic US At [facility] (plans transfer to [facility]) Rubella: Immune (12/27 0000)  GTT  RPR: Nonreactive (12/27 0000)   Flu vaccine 01/28/18 HBsAg: Negative (12/27 0000)   TDaP vaccine                                               Rhogam: N/A HIV: Non-reactive (12/27 0000)   Baby Food      Breast                                         GBS: (For PCN allergy, check sensitivities)  Contraception  Pap: 05/09/16 ASCUS with +HRHPV  Circumcision  LGSIL pap HPV positive, 16-18 negative 11/27/2017 [ ]  Colposcopy postpartum or outside of first trimester  Pediatrician    Support Person            Abnormal Pap smear of cervix 08/13/2016 by Farrel Conners, CNM No   Overview Addendum 01/28/2018  2:49 PM by Vena Austria, MD    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  11/27/2017 LGSIL HPV positive 16/18 neg / neg [ ]  colpo outside of first trimester (preferred) or postpartum (acceptable)      Abnormal Pap smear of vagina 01/28/2018 by Vena Austria, MD 01/28/2018 by Vena Austria, MD   Overview Signed 01/28/2018  2:46  PM by Vena Austria, MD    [ ]  Colposcopy postpartum         Gestational age appropriate obstetric precautions including but not limited to vaginal bleeding, contractions, leaking of fluid and fetal movement were reviewed in detail with the patient.    - [facility] to [facility] at [facility] - patient plan to delivery at [facility]  Return in about 4 weeks (around 02/25/2018) for ROB and colpsocopy.  Vena Austria, MD, [name], [facility] 01/28/2018, 2:49 PM

## 2018-01-28 NOTE — Progress Notes (Signed)
ROB FLU vaccine

## 2018-02-12 ENCOUNTER — Encounter: Payer: Self-pay | Admitting: Obstetrics and Gynecology

## 2018-02-12 ENCOUNTER — Ambulatory Visit (INDEPENDENT_AMBULATORY_CARE_PROVIDER_SITE_OTHER): Payer: Medicaid Other | Admitting: Obstetrics and Gynecology

## 2018-02-12 VITALS — BP 118/74 | Wt 156.0 lb

## 2018-02-12 DIAGNOSIS — R87610 Atypical squamous cells of undetermined significance on cytologic smear of cervix (ASC-US): Secondary | ICD-10-CM

## 2018-02-12 DIAGNOSIS — O3122X2 Continuing pregnancy after intrauterine death of one fetus or more, second trimester, fetus 2: Secondary | ICD-10-CM

## 2018-02-12 DIAGNOSIS — O09899 Supervision of other high risk pregnancies, unspecified trimester: Secondary | ICD-10-CM

## 2018-02-12 DIAGNOSIS — O0990 Supervision of high risk pregnancy, unspecified, unspecified trimester: Secondary | ICD-10-CM

## 2018-02-12 DIAGNOSIS — Z3A16 16 weeks gestation of pregnancy: Secondary | ICD-10-CM

## 2018-02-12 DIAGNOSIS — O30049 Twin pregnancy, dichorionic/diamniotic, unspecified trimester: Secondary | ICD-10-CM

## 2018-02-12 NOTE — Progress Notes (Signed)
Routine Prenatal Care Visit  Subjective  Margaret [name] is a 32 y.o. G4P3003 at [redacted]w[redacted]d being seen today for ongoing prenatal care.  She is currently monitored for the following issues for this high-risk pregnancy and has Lump or mass in breast; Supervision of high risk pregnancy, antepartum; Abnormal Pap smear of cervix; Dichorionic diamniotic twin pregnancy, antepartum; Short interval between pregnancies complicating pregnancy, antepartum; and Twin pregnancy with single intrauterine death, second trimester, fetus 2 on their problem list.  ----------------------------------------------------------------------------------- Patient reports generalized itching (sparing palms/soles) with onset yesterday. She took benadryl at 230 this morning and was able to sleep. Denies skin rash.  Denies new medication, new detergent, fabric, soap, or any other environmental changes.  She has not started feeling fetal movement.   Contractions: Not present. Vag. Bleeding: None.  Movement: Absent. Denies leaking of fluid.  ----------------------------------------------------------------------------------- The following portions of the patient's history were reviewed and updated as appropriate: allergies, current medications, past family history, past medical history, past social history, past surgical history and problem list. Problem list updated.  Objective  Blood pressure 118/74, weight 156 lb (70.8 kg), last menstrual period 10/20/2017, unknown if currently breastfeeding. Pregravid weight 135 lb (61.2 kg) Total Weight Gain 21 lb (9.526 kg) Urinalysis: Urine Protein    Urine Glucose    Fetal Status: Fetal Heart Rate (bpm): 145/Absent   Movement: Absent     General:  Alert, oriented and cooperative. Patient is in no acute distress.  Skin: Skin is warm and dry. No rash noted.   Cardiovascular: Normal heart rate noted  Respiratory: Normal respiratory effort, no problems with respiration noted  Abdomen: Soft,  gravid, appropriate for gestational age. Pain/Pressure: Present     Pelvic:  Cervical exam deferred        Extremities: Normal range of motion.     Mental Status: Normal mood and affect. Normal behavior. Normal judgment and thought content.   Bedside ultrasound performed by me: Fetus a in transverse presentation.  Subjectively normal fluid.  Frequent fetal movement.  Fetal heart rate measured at 145 bpm.  Placenta noted to be very close to internal cervical office.  Suspicion for placenta previa, possibly marginal.  Did not perform transvaginal ultrasound to investigate further. Fetus B in breech presentation on maternal right.  Grossly visualized to be smaller than fetus A.  No spontaneous movements of the baby.  No fetal heart activity noted on M-mode and Doppler interrogation.  Color-flow utilized with no active flow within the fetal chest or any other aspect of the fetal body.  Fluid subjectively normal.  Assessment   32 y.o. [id number] at [redacted]w[redacted]d by  07/27/2018, by Last Menstrual Period presenting for work-in prenatal visit  Plan   Pregnancy#4 Problems (from 10/20/17 to present)    Problem Noted Resolved   Twin pregnancy with single intrauterine death, second trimester, fetus 2 02/12/2018 by Conard Novak, MD No   Overview Signed 02/12/2018 12:42 PM by Conard Novak, MD    Diagnosed 02/12/18 [ ]  immediate referral to [facility] (patient already has requested to have care transferred to [facility] for delivery). I have tried to arrange an emergent consultation with [facility] for 10/3.  Patient to be called today or tomorrow to arrange appointment. Appt may be in [place] or [place].       Dichorionic diamniotic twin pregnancy, antepartum 12/03/2017 by Vena Austria, MD No   Overview Addendum 01/28/2018  2:43 PM by Vena Austria, MD    [X]  additional 1g folic acid  [X]   baseline preeclampsia labs [ ]  ASA after 1st trimester [ ]  Monthly growth scan [ ]  Delivery planning      Short  interval between pregnancies complicating pregnancy, antepartum 12/03/2017 by Vena Austria, MD No   Supervision of high risk pregnancy, antepartum 08/13/2016 by Farrel Conners, CNM No   Overview Addendum 01/28/2018  2:47 PM by Vena Austria, MD     [facility]inic [facility] Prenatal Labs  Dating LMP = 6 week [place] Di/Di twin pregnancy Blood type: A/Positive/-- (12/27 0000)   Genetic Screen NIPS: Normal XX SMA neg, CF, neg, and Fragile-X neg Antibody:Negative (12/27 0000)  Anatomic [place] [facility] (plans transfer to [facility]) Rubella: Immune (12/27 0000)  GTT  RPR: Nonreactive (12/27 0000)   Flu vaccine 01/28/18 HBsAg: Negative (12/27 0000)   TDaP vaccine                                               Rhogam: N/A HIV: Non-reactive (12/27 0000)   Baby Food      Breast                                         GBS: (For PCN allergy, check sensitivities)  Contraception  Pap: 05/09/16 ASCUS with +HRHPV  Circumcision  LGSIL pap HPV positive, 16-18 negative 11/27/2017 [ ]  Colposcopy postpartum or outside of first trimester  Pediatrician    Support Person         Abnormal Pap smear of cervix 08/13/2016 by Farrel Conners, CNM No   Overview Addendum 01/28/2018  2:49 PM by Vena Austria, MD    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  11/27/2017 LGSIL HPV positive 16/18 neg / neg [ ]  colpo outside of first trimester (preferred) or postpartum (acceptable)      Abnormal Pap smear of vagina 01/28/2018 by Vena Austria, MD 01/28/2018 by Vena Austria, MD   Overview Signed 01/28/2018  2:46 PM by Vena Austria, MD    [ ]  Colposcopy postpartum          Preterm labor symptoms and general obstetric precautions including but not limited to vaginal bleeding, contractions, leaking of fluid and fetal movement were reviewed in detail with the patient. Please refer to After Visit Summary for other counseling recommendations.   Demise of fetus B: I discussed my findings with the patient.  She  was appropriately upset.  She did have questions pertaining to the risk to the surviving fetus.  Discussed that the outcome would be hard to predict.  However, I would like her to have a consultation, if not a transfer, to [facility] with MFM supervision.  We also discussed the location of her placenta for fetus A.  She was [name] precautions for bleeding or other concerning symptoms.  Generalized itching: Since she discovered today supersedes importance of itching in the short-term will have her take Benadryl as needed for itching.  Unsure whether etiology for itching is related to demise of fetus B.  Plan for today had been to check bile acids, at a minimum to establish baseline.  Also, she was [name] precautions for worsening itching and development of palmar and plantar itching.  Low TSH: On December 31, 2017 patient had a TSH of less than 0.006.  No free T4 or free thyroxine  has been measured.  Original plan for today was to redraw TFTs and treat accordingly.  Unsure whether thyroid issue may have contributed to fetal demise.  She has no overt symptoms of thyrotoxicosis.  Plan moving forward will be based on recommendations from [facility].  It is possible she will transfer to [facility] for the rest of her current pregnancy.  However, I let her know that we would be available should she have any that arises.   Thomasene Mohair, MD, [name], [facility] 02/12/2018 12:35 PM

## 2018-02-13 ENCOUNTER — Telehealth: Payer: Self-pay

## 2018-02-13 ENCOUNTER — Other Ambulatory Visit: Payer: Self-pay

## 2018-02-13 ENCOUNTER — Ambulatory Visit
Admission: RE | Admit: 2018-02-13 | Discharge: 2018-02-13 | Disposition: A | Discharge status: Home / Self Care | Payer: Medicaid Other | Source: Ambulatory Visit | Attending: Obstetrics and Gynecology | Admitting: Obstetrics and Gynecology

## 2018-02-13 ENCOUNTER — Other Ambulatory Visit: Payer: Self-pay | Admitting: Obstetrics and Gynecology

## 2018-02-13 VITALS — BP 104/69 | HR 89 | Temp 98.0°F | Resp 18 | Wt 154.0 lb

## 2018-02-13 DIAGNOSIS — O30049 Twin pregnancy, dichorionic/diamniotic, unspecified trimester: Secondary | ICD-10-CM

## 2018-02-13 DIAGNOSIS — L299 Pruritus, unspecified: Secondary | ICD-10-CM | POA: Diagnosis not present

## 2018-02-13 DIAGNOSIS — Z87891 Personal history of nicotine dependence: Secondary | ICD-10-CM | POA: Insufficient documentation

## 2018-02-13 DIAGNOSIS — O26892 Other specified pregnancy related conditions, second trimester: Secondary | ICD-10-CM | POA: Diagnosis not present

## 2018-02-13 DIAGNOSIS — O30042 Twin pregnancy, dichorionic/diamniotic, second trimester: Secondary | ICD-10-CM | POA: Insufficient documentation

## 2018-02-13 DIAGNOSIS — O99712 Diseases of the skin and subcutaneous tissue complicating pregnancy, second trimester: Secondary | ICD-10-CM

## 2018-02-13 DIAGNOSIS — R7989 Other specified abnormal findings of blood chemistry: Secondary | ICD-10-CM

## 2018-02-13 DIAGNOSIS — O0990 Supervision of high risk pregnancy, unspecified, unspecified trimester: Secondary | ICD-10-CM

## 2018-02-13 DIAGNOSIS — O3112X0 Continuing pregnancy after spontaneous abortion of one fetus or more, second trimester, not applicable or unspecified: Secondary | ICD-10-CM | POA: Insufficient documentation

## 2018-02-13 DIAGNOSIS — Z3A17 17 weeks gestation of pregnancy: Secondary | ICD-10-CM | POA: Diagnosis not present

## 2018-02-13 NOTE — Addendum Note (Signed)
Encounter addended by: Kirby Funk, MD on: 02/13/2018 3:56 PM  Actions taken: Sign clinical note

## 2018-02-13 NOTE — Telephone Encounter (Signed)
Pt was seen yesterday; wanted to have blood work done and didn't know whether to come here or [facility].  [phone]  Left detailed msg lab orders were not in yet; SDJ on call; will send msg to him; one of [place] will call her when orders are in; she can come to office to have drawn.

## 2018-02-13 NOTE — Telephone Encounter (Signed)
Orders for total bile acids and TSH/FT4 entered.

## 2018-02-13 NOTE — Telephone Encounter (Signed)
Left detailed msg labs orders are entered.  They draw from 8-12 and 2-4.  She does not need to be fasting.

## 2018-02-13 NOTE — Progress Notes (Addendum)
[facility]aternal-[facility]etal [facility] Consultation   Chief Complaint: demise of Twin B  HPI: Margaret [name] Given is a 32 y.o. [phone] at [redacted]w[redacted]d by 6 week [place] (LMP was approximate) who presents in consultation from [facility] for discussion of implications of recently diagnosed single intrauterine death in a twin pregnancy.  She also reports having itching at night for the past few nights for which she has taken benadryl.  She has no rash.  She also had a very low TSH on 12/31/2017 (Dr. Jean Rosenthal had intended to repeat this yesterday but due to finding of the demise, this was not drawn).  Per Dr. Edison Pace note from yesterday, bedside US demonstrated concern for placenta previa.  No images were archived, however, so images cannot be reviewed.  Past Medical History: Patient has a past medical history of abnormal PAPs Past Surgical History: She has a past surgical history that includes Tooth extraction.  Obstetric History:  OB History    Gravida  4   Para  3   Term  3   Preterm      AB      Living  3     SAB      TAB      Ectopic      Multiple  0   Live Births  3        Obstetric Comments  1st Menstrual Cycle:  15 1st Pregnancy:  23  First labor was induced for postdates Second labor augmented after PROM      2007  42 weeks, NVD, female infant (7#11oz) 2010 40 weeks, NVD, female infant (8#11oz) 2018 41 weeks, NVD female infant (9# 6oz) 2019 current FOB #1 - first 2 pregnancies FOB #2 - current and last pregnancy.  He has no children by any other relationship.  Gynecologic History:  Patient's last menstrual period was 10/20/2017 (approximate). 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  Social History: Patient  reports that she quit smoking when she found out about the current pregnancy. She smoked 0.00  packs per day for 0.00 years. She has never used smokeless tobacco. She reports that she does not drink alcohol or use drugs.  Family History: denies any significant family history including heart disease, cancer, diabetes, stroke, bleeding disorders, birth defects, miscarriage.  Her grandfather is a twin. Review of Systems A full 12 point review of systems was negative or as noted in the History of Present Illness.  Physical Exam: BP 104/69 (BP Location: Right Arm)   Pulse 89   Temp 98 F (36.7 C) (Oral)   Resp 18   Wt 69.9 kg   LMP 10/20/2017 (Approximate)   SpO2 97%   BMI 24.12 kg/m    Bedside US confirms presence of FHR for surviving twin. Cell free fetal DNA screen results were normal (XX) HIV neg SMA, CF and Fragile-X neg A+/- Rubella immune RPR NR HBsAg negative  Received flu vaccine on 01/28/2018  Asessement: 1. Dichorionic diamniotic twin pregnancy, antepartum with demise of one twin  2.      Itching 3.      Low TSH 4.      Possible placenta previa. 5.      Abnormal PAP  Plan: We discussed that demise of a co-twin in a dichorionic diamniotic pregnancy is unlikely to impact the health  of the surviving twin and may, in fact, improve outcomes for the surviving twin (decreased risk for preterm delivery or growth abnormalities) and maternal health (decreased risk for gestational diabetes, preeclampsia, preterm labor, need for c-section).  The couple were understandably devastated by the news of the death but I tried to reassure them that unlike a monochorionic gestation, the type of twins that she had are usually protective of any effects of a co-twin demise.  She is scheduled for an anatomy US and transfer of care appointment at [facility] on 10/14.  She was encouraged to keep this appointment although she was counseled that since she now essentially has a singleton pregnancy, she may continue her care with [facility], if desired.  Of note, would NOT offer msAFP only given  that presence of a demised twin will likely falsely elevate the results.  Although cholestasis would be unusual at this gestational age, will draw bile acids.  Will also repeat TFTs although low TSH was likely secondary to the twin pregnancy, early in gestation.  She has no symptoms of being hyperthyroid.  Will evaluate placental location at the time of follow up ultrasound.  Ms. Given was advised that should she experience any vaginal bleeding, she should be evaluated to determine source of bleeding.    Lastly, recommend colpo postpartum for the abnormal PAP history.  Labs ordered:  Bile acids, thyroid panel with TSH  Total time spent with the patient was 30 minutes with greater than 50% spent in counseling and coordination of care.  We appreciate this interesting consult and will be happy to be involved in the ongoing care of Ms. Given in anyway her obstetricians desire.  Kirby Funk, MD Maternal-Fetal Medicine [facility]

## 2018-02-14 LAB — BILE ACIDS, TOTAL: Bile Acids Total: 1.3 umol/L (ref 0.0–10.0)

## 2018-02-15 LAB — THYROID PANEL WITH TSH
Free Thyroxine Index: 1.4 (ref 1.2–4.9)
T3 Uptake Ratio: 13 % — ABNORMAL LOW (ref 24–39)
T4, Total: 11.1 ug/dL (ref 4.5–12.0)
TSH: 0.994 u[IU]/mL (ref 0.450–4.500)

## 2018-02-25 ENCOUNTER — Ambulatory Visit (INDEPENDENT_AMBULATORY_CARE_PROVIDER_SITE_OTHER): Payer: Medicaid Other | Admitting: Obstetrics and Gynecology

## 2018-02-25 VITALS — BP 102/62 | Wt 161.0 lb

## 2018-02-25 DIAGNOSIS — Z0489 Encounter for examination and observation for other specified reasons: Secondary | ICD-10-CM

## 2018-02-25 DIAGNOSIS — O3122X2 Continuing pregnancy after intrauterine death of one fetus or more, second trimester, fetus 2: Secondary | ICD-10-CM

## 2018-02-25 DIAGNOSIS — IMO0002 Reserved for concepts with insufficient information to code with codable children: Secondary | ICD-10-CM

## 2018-02-25 DIAGNOSIS — Z3A18 18 weeks gestation of pregnancy: Secondary | ICD-10-CM

## 2018-02-25 DIAGNOSIS — O0990 Supervision of high risk pregnancy, unspecified, unspecified trimester: Secondary | ICD-10-CM

## 2018-02-25 DIAGNOSIS — O09899 Supervision of other high risk pregnancies, unspecified trimester: Secondary | ICD-10-CM

## 2018-02-25 LAB — POCT URINALYSIS DIPSTICK OB
Glucose, UA: NEGATIVE
POC,PROTEIN,UA: NEGATIVE

## 2018-02-25 NOTE — Progress Notes (Signed)
Routine Prenatal Care Visit  Subjective  Margaret [name] Given is a 32 y.o. G4P3003 at [redacted]w[redacted]d being seen today for ongoing prenatal care.  She is currently monitored for the following issues for this high-risk pregnancy and has Lump or mass in breast; Supervision of high risk pregnancy, antepartum; Abnormal Pap smear of cervix; Dichorionic diamniotic twin pregnancy, antepartum; Short interval between pregnancies complicating pregnancy, antepartum; and Twin pregnancy with single intrauterine death, second trimester, fetus 2 on their problem list.  ----------------------------------------------------------------------------------- Patient reports no complaints.   Contractions: Not present. Vag. Bleeding: None.  Movement: Present. Denies leaking of fluid.  ----------------------------------------------------------------------------------- The following portions of the patient's history were reviewed and updated as appropriate: allergies, current medications, past family history, past medical history, past social history, past surgical history and problem list. Problem list updated.   Objective  Blood pressure 102/62, weight 161 lb (73 kg), last menstrual period 10/20/2017, unknown if currently breastfeeding. Pregravid weight 135 lb (61.2 kg) Total Weight Gain 26 lb (11.8 kg) Urinalysis:      Fetal Status: Fetal Heart Rate (bpm): 140   Movement: Present     General:  Alert, oriented and cooperative. Patient is in no acute distress.  Skin: Skin is warm and dry. No rash noted.   Cardiovascular: Normal heart rate noted  Respiratory: Normal respiratory effort, no problems with respiration noted  Abdomen: Soft, gravid, appropriate for gestational age. Pain/Pressure: Absent     Pelvic:  Cervical exam deferred        Extremities: Normal range of motion.     ental Status: Normal mood and affect. Normal behavior. Normal judgment and thought content.     Assessment   32 y.o. [id number] at [redacted]w[redacted]d by   07/24/2018, by Ultrasound presenting for routine prenatal visit  Plan   Pregnancy#4 Problems (from 10/20/17 to present)    Problem Noted Resolved   Twin pregnancy with single intrauterine death, second trimester, fetus 2 02/12/2018 by Conard Novak, MD No   Overview Signed 02/12/2018 12:42 PM by Conard Novak, MD    Diagnosed 02/12/18 [ ]  immediate referral to [facility] (patient already has requested to have care transferred to [facility] for delivery). I have tried to arrange an emergent consultation with [facility] for 10/3.  Patient to be called today or tomorrow to arrange appointment. Appt may be in [place] or [place].       Dichorionic diamniotic twin pregnancy, antepartum 12/03/2017 by Vena Austria, MD No   Overview Addendum 01/28/2018  2:43 PM by Vena Austria, MD    [X]  additional 1g folic acid  [X]  baseline preeclampsia labs [ ]  ASA after 1st trimester [ ]  Monthly growth scan [ ]  Delivery planning      Short interval between pregnancies complicating pregnancy, antepartum 12/03/2017 by Vena Austria, MD No   Supervision of high risk pregnancy, antepartum 08/13/2016 by Farrel Conners, CNM No   Overview Addendum 01/28/2018  2:47 PM by Vena Austria, MD     Clinic [facility] Prenatal Labs  Dating LMP = 6 week [place] Di/Di twin pregnancy Blood type: A/Positive/-- (12/27 0000)   Genetic Screen NIPS: Normal XX SMA neg, CF, neg, and Fragile-X neg Antibody:Negative (12/27 0000)  Anatomic [place] [facility] (plans transfer to [facility]) Rubella: Immune (12/27 0000)  GTT  RPR: Nonreactive (12/27 0000)   Flu vaccine 01/28/18 HBsAg: Negative (12/27 0000)   TDaP vaccine  Rhogam: N/A HIV: Non-reactive (12/27 0000)   Baby Food      Breast                                         GBS: (For PCN allergy, check sensitivities)  Contraception  Pap: 05/09/16 ASCUS with +HRHPV  Circumcision  LGSIL pap HPV positive, 16-18 negative 11/27/2017 [ ]  Colposcopy  postpartum or outside of first trimester  Pediatrician    Support Person            Abnormal Pap smear of cervix 08/13/2016 by Farrel Conners, CNM No   Overview Addendum 01/28/2018  2:49 PM by Vena Austria, MD    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  11/27/2017 LGSIL HPV positive 16/18 neg / neg [ ]  colpo outside of first trimester (preferred) or postpartum (acceptable)      Abnormal Pap smear of vagina 01/28/2018 by Vena Austria, MD 01/28/2018 by Vena Austria, MD   Overview Signed 01/28/2018  2:46 PM by Vena Austria, MD    [ ]  Colposcopy postpartum          Gestational age appropriate obstetric precautions including but not limited to vaginal bleeding, contractions, leaking of fluid and fetal movement were reviewed in detail with the patient.   -Doing well from mood standpoint following demise of one twin - anatomy scan at [facility] incomplete for fetal spine follow up ordered,  No MSAFP recommended given fetal demise  Return in about 2 weeks (around 03/11/2018) for ROB and follow up anatomy scan.  Vena Austria, MD, [name], [facility]

## 2018-03-12 ENCOUNTER — Ambulatory Visit (INDEPENDENT_AMBULATORY_CARE_PROVIDER_SITE_OTHER): Payer: Medicaid Other | Admitting: Obstetrics and Gynecology

## 2018-03-12 ENCOUNTER — Ambulatory Visit (INDEPENDENT_AMBULATORY_CARE_PROVIDER_SITE_OTHER): Payer: Medicaid Other

## 2018-03-12 VITALS — BP 100/52 | Wt 164.0 lb

## 2018-03-12 DIAGNOSIS — O09899 Supervision of other high risk pregnancies, unspecified trimester: Secondary | ICD-10-CM

## 2018-03-12 DIAGNOSIS — Z0489 Encounter for examination and observation for other specified reasons: Secondary | ICD-10-CM

## 2018-03-12 DIAGNOSIS — O0990 Supervision of high risk pregnancy, unspecified, unspecified trimester: Secondary | ICD-10-CM

## 2018-03-12 DIAGNOSIS — O3122X2 Continuing pregnancy after intrauterine death of one fetus or more, second trimester, fetus 2: Secondary | ICD-10-CM | POA: Diagnosis not present

## 2018-03-12 DIAGNOSIS — Z3A20 20 weeks gestation of pregnancy: Secondary | ICD-10-CM | POA: Diagnosis not present

## 2018-03-12 DIAGNOSIS — IMO0002 Reserved for concepts with insufficient information to code with codable children: Secondary | ICD-10-CM

## 2018-03-12 LAB — POCT URINALYSIS DIPSTICK OB
GLUCOSE, UA: NEGATIVE
PROTEIN: NEGATIVE

## 2018-03-12 NOTE — Progress Notes (Signed)
Anatomy Scan/ It is a GIRL!! ROB

## 2018-03-12 NOTE — Progress Notes (Signed)
Routine Prenatal Care Visit  Subjective  Margaret [name] is a 32 y.o. [phone] at [redacted]w[redacted]d being seen today for ongoing prenatal care.  She is currently monitored for the following issues for this high-risk pregnancy and has Lump or mass in breast; Supervision of high risk pregnancy, antepartum; Abnormal Pap smear of cervix; Dichorionic diamniotic twin pregnancy, antepartum; Short interval between pregnancies complicating pregnancy, antepartum; and Twin pregnancy with single intrauterine death, second trimester, fetus 2 on their problem list.  ----------------------------------------------------------------------------------- Patient reports no complaints.   Contractions: Not present. Vag. Bleeding: None.  Movement: Present. Denies leaking of fluid.  ----------------------------------------------------------------------------------- The following portions of the patient's history were reviewed and updated as appropriate: allergies, current medications, past family history, past medical history, past social history, past surgical history and problem list. Problem list updated.   Objective  Blood pressure (!) 100/52, weight 164 lb (74.4 kg), last menstrual period 10/20/2017, unknown if currently breastfeeding. Pregravid weight 135 lb (61.2 kg) Total Weight Gain 29 lb (13.2 kg) Urinalysis:      Fetal Status: Fetal Heart Rate (bpm): 145   Movement: Present     General:  Alert, oriented and cooperative. Patient is in no acute distress.  Skin: Skin is warm and dry. No rash noted.   Cardiovascular: Normal heart rate noted  Respiratory: Normal respiratory effort, no problems with respiration noted  Abdomen: Soft, gravid, appropriate for gestational age. Pain/Pressure: Absent     Pelvic:  Cervical exam deferred        Extremities: Normal range of motion.     ental Status: Normal mood and affect. Normal behavior. Normal judgment and thought content.   US Ob Follow Up  Result Date:  03/12/2018 Patient Name: Margaret [name] DOB: 1986-02-02 MRN: [id number] ULTRASOUND REPORT Location: [facility] Date of Service: 03/12/2018 Indications: Anatomy follow up ultrasound Findings: Twin A: Intrauterine pregnancy is visualized with FHR at 148 BPM. Fetal presentation is Breech. Placenta: anterior. Grade: 0 AFI: subjectively normal. Anatomic survey is complete and normal for fetal spine. There is no free peritoneal fluid in the cul de sac. Twin B: Fetal demise measuring [redacted]w[redacted]d Impression: 1. [redacted]w[redacted]d Twin Intrauterine pregnancy with twin B demise. 2. Normal Anatomy Scan is now complete Recommendations: 1.Clinical correlation with the patient's History and Physical Exam. Darlina Guys, RDMS RVT There is a singleton gestation with subjectively normal amniotic fluid volume.  Limited evaluation of the fetal anatomy was performed today, focusing on on anatomic structures not fully visualized at the time of prior study.The visualized fetal anatomical survey appears within normal limits within the resolution of ultrasound as described above, and the anatomic survey is now complete.  It must be noted that a normal ultrasound is unable to rule out fetal aneuploidy.  Vena Austria, MD, [facility], [facility] 03/12/2018, 10:02 PM     Assessment   32 y.o. [id number] at [redacted]w[redacted]d by  07/24/2018, by Ultrasound presenting for routine prenatal visit  Plan   Pregnancy#4 Problems (from 10/20/17 to present)    Problem Noted Resolved   Twin pregnancy with single intrauterine death, second trimester, fetus 2 02/12/2018 by Conard Novak, MD No   Overview Signed 02/12/2018 12:42 PM by Conard Novak, MD    Diagnosed 02/12/18 [ ]  immediate referral to [facility] (patient already has requested to have care transferred to [facility] for delivery). I have tried to arrange an emergent consultation with [facility] for 10/3.  Patient to be called today or tomorrow to  arrange appointment. Appt may be in  [place] or [place].       Dichorionic diamniotic twin pregnancy, antepartum 12/03/2017 by Vena Austria, MD No   Overview Addendum 01/28/2018  2:43 PM by Vena Austria, MD    [X]  additional 1g folic acid  [X]  baseline preeclampsia labs [ ]  ASA after 1st trimester [ ]  Monthly growth scan [ ]  Delivery planning      Short interval between pregnancies complicating pregnancy, antepartum 12/03/2017 by Vena Austria, MD No   Supervision of high risk pregnancy, antepartum 08/13/2016 by Farrel Conners, CNM No   Overview Addendum 03/12/2018 11:16 AM by Vena Austria, MD     Clinic [facility] Prenatal Labs  Dating LMP = 6 week [place] Di/Di twin pregnancy Blood type: A/Positive/-- (12/27 0000)   Genetic Screen NIPS: Normal XX SMA neg, CF, neg, and Fragile-X neg Antibody:Negative (12/27 0000)  Anatomic [place] Incomplete spine views at [facility], completed at 20 weeks here at [facility] Rubella: Immune (12/27 0000)  GTT  RPR: Nonreactive (12/27 0000)   Flu vaccine 01/28/18 HBsAg: Negative (12/27 0000)   TDaP vaccine                                               Rhogam: N/A HIV: Non-reactive (12/27 0000)   Baby Food Breast                                         GBS: (For PCN allergy, check sensitivities)  Contraception  Pap: 05/09/16 ASCUS with +HRHPV  Circumcision  LGSIL pap HPV positive, 16-18 negative 11/27/2017 [ ]  Colposcopy postpartum or outside of first trimester  Pediatrician    Support Person            Abnormal Pap smear of cervix 08/13/2016 by Farrel Conners, CNM No   Overview Addendum 01/28/2018  2:49 PM by Vena Austria, MD    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  11/27/2017 LGSIL HPV positive 16/18 neg / neg [ ]  colpo outside of first trimester (preferred) or postpartum (acceptable)      Abnormal Pap smear of vagina 01/28/2018 by Vena Austria, MD 01/28/2018 by Vena Austria, MD   Overview Signed 01/28/2018  2:46 PM by Vena Austria, MD     [ ]  Colposcopy postpartum          Gestational age appropriate obstetric precautions including but not limited to vaginal bleeding, contractions, leaking of fluid and fetal movement were reviewed in detail with the patient.    Return in about 4 weeks (around 04/09/2018) for rob.  Vena Austria, MD, [name], [facility]

## 2018-04-09 ENCOUNTER — Ambulatory Visit (INDEPENDENT_AMBULATORY_CARE_PROVIDER_SITE_OTHER): Payer: Medicaid Other | Admitting: Obstetrics and Gynecology

## 2018-04-09 VITALS — BP 114/69 | Wt 177.0 lb

## 2018-04-09 DIAGNOSIS — O0990 Supervision of high risk pregnancy, unspecified, unspecified trimester: Secondary | ICD-10-CM

## 2018-04-09 DIAGNOSIS — N760 Acute vaginitis: Secondary | ICD-10-CM | POA: Diagnosis not present

## 2018-04-09 DIAGNOSIS — O3122X2 Continuing pregnancy after intrauterine death of one fetus or more, second trimester, fetus 2: Secondary | ICD-10-CM

## 2018-04-09 DIAGNOSIS — Z3A24 24 weeks gestation of pregnancy: Secondary | ICD-10-CM

## 2018-04-09 DIAGNOSIS — O09899 Supervision of other high risk pregnancies, unspecified trimester: Secondary | ICD-10-CM

## 2018-04-09 DIAGNOSIS — B9689 Other specified bacterial agents as the cause of diseases classified elsewhere: Secondary | ICD-10-CM | POA: Diagnosis not present

## 2018-04-09 DIAGNOSIS — Z131 Encounter for screening for diabetes mellitus: Secondary | ICD-10-CM

## 2018-04-09 LAB — POCT URINALYSIS DIPSTICK OB
Glucose, UA: NEGATIVE
POC,PROTEIN,UA: NEGATIVE

## 2018-04-09 MED ORDER — METRONIDAZOLE 500 MG PO TABS: 500.0000 mg | ORAL_TABLET | Freq: Two times a day (BID) | ORAL | 0 refills | Status: DC

## 2018-04-09 NOTE — Progress Notes (Signed)
ROB Vaginal irritation/ discharge w/ odor

## 2018-04-09 NOTE — Progress Notes (Signed)
Routine Prenatal Care Visit  Subjective  Margaret BasemanHeather M [name] is a 32 y.o. [phone] at 4464w6d being seen today for ongoing prenatal care.  She is currently monitored for the following issues for this high-risk pregnancy and has Lump or mass in breast; Supervision of high risk pregnancy, antepartum; Abnormal Pap smear of cervix; Dichorionic diamniotic twin pregnancy, antepartum; Short interval between pregnancies complicating pregnancy, antepartum; and Twin pregnancy with single intrauterine death, second trimester, fetus 2 on their problem list.  ----------------------------------------------------------------------------------- Patient reports no complaints.   Contractions: Not present. Vag. Bleeding: None.  Movement: Present. Denies leaking of fluid.  ----------------------------------------------------------------------------------- The following portions of the patient's history were reviewed and updated as appropriate: allergies, current medications, past family history, past medical history, past social history, past surgical history and problem list. Problem list updated.   Objective  Last menstrual period 10/20/2017, unknown if currently breastfeeding. Pregravid weight 135 lb (61.2 kg) Total Weight Gain 42 lb (19.1 kg) Urinalysis:      Fetal Status: Fetal Heart Rate (bpm): 145   Movement: Present     General:  Alert, oriented and cooperative. Patient is in no acute distress.  Skin: Skin is warm and dry. No rash noted.   Cardiovascular: Normal heart rate noted  Respiratory: Normal respiratory effort, no problems with respiration noted  Abdomen: Soft, gravid, appropriate for gestational age. Pain/Pressure: Absent     Pelvic:  Cervical exam deferred        Extremities: Normal range of motion.     ental Status: Normal mood and affect. Normal behavior. Normal judgment and thought content.  Wet Prep: PH: 5.0 Clue Cells: Positive Fungal elements: Negative Trichomonas:  Negative  Amsel's Criteria Bacterial Vaginosis Clinical diagnosis required presence of 3 of the below 4:  1) Homogenous thin white discharge present 2) Presence of clue cells present 3) Vaginal pH >4.5 present 4) Positive whiff test absent     Assessment   32 y.o. [id number] at 8364w6d by  07/24/2018, by Ultrasound presenting for routine prenatal visit  Plan   Pregnancy#4 Problems (from 10/20/17 to present)    Problem Noted Resolved   Twin pregnancy with single intrauterine death, second trimester, fetus 2 02/12/2018 by Conard NovakJackson, Stephen D, MD No   Overview Signed 02/12/2018 12:42 PM by Conard NovakJackson, Stephen D, MD    Diagnosed 02/12/18 [ ]  immediate referral to [facility] ([facility] I have tried to arrange an emergent consultation with [facility] for 10/3.  Patient to be called today or tomorrow to arrange appointment. Appt may be in [place] or [place].       Dichorionic diamniotic twin pregnancy, antepartum 12/03/2017 by Vena AustriaStaebler, Glendola Friedhoff, MD No   Overview Addendum 01/28/2018  2:43 PM by Vena AustriaStaebler, Anyi Fels, MD    [X]  additional 1g folic acid  [X]  baseline preeclampsia labs [ ]  ASA after 1st trimester [ ]  Monthly growth scan [ ]  Delivery planning      Short interval between pregnancies complicating pregnancy, antepartum 12/03/2017 by Vena AustriaStaebler, Sahan Pen, MD No   Supervision of high risk pregnancy, antepartum 08/13/2016 by Farrel ConnersGutierrez, Colleen, CNM No   Overview Addendum 03/12/2018 11:16 AM by Vena AustriaStaebler, Jaelyn Cloninger, MD     Clinic [facility] Prenatal Labs  Dating LMP = 6 week US Di/Di twin pregnancy Blood type: A/Positive/-- (12/27 0000)   Genetic Screen NIPS: Normal XX SMA neg, CF, neg, and Fragile-X neg Antibody:Negative (12/27 0000)  [facility]atomic [facility], completed at 20  weeks here at [facility] Rubella: Immune (12/27 0000)  GTT  RPR: Nonreactive (12/27 0000)   Flu vaccine 01/28/18 HBsAg: Negative (12/27 0000)   TDaP  vaccine                                               Rhogam: N/A HIV: Non-reactive (12/27 0000)   Baby Food Breast                                         GBS: (For PCN allergy, check sensitivities)  Contraception  Pap: 05/09/16 ASCUS with +HRHPV  Circumcision  LGSIL pap HPV positive, 16-18 negative 11/27/2017 [ ]  Colposcopy postpartum or outside of first trimester  Pediatrician    Support Person            Abnormal Pap smear of cervix 08/13/2016 by Farrel Conners, CNM No   Overview Addendum 01/28/2018  2:49 PM by Vena Austria, MD    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  11/27/2017 LGSIL HPV positive 16/18 neg / neg [ ]  colpo outside of first trimester (preferred) or postpartum (acceptable)      Abnormal Pap smear of vagina 01/28/2018 by Vena Austria, MD 01/28/2018 by Vena Austria, MD   Overview Signed 01/28/2018  2:46 PM by Vena Austria, MD    [ ]  Colposcopy postpartum          Gestational age appropriate obstetric precautions including but not limited to vaginal bleeding, contractions, leaking of fluid and fetal movement were reviewed in detail with the patient.    - flagyl for BV  Return in about 4 weeks (around 05/07/2018) for ROB and 28 week labs.  Vena Austria, MD, [facility], [facility] 04/09/2018, 10:03 AM

## 2018-04-28 ENCOUNTER — Observation Stay
Admission: RE | Admit: 2018-04-28 | Discharge: 2018-04-28 | Disposition: A | Discharge status: Home / Self Care | Payer: Medicaid Other | Attending: Obstetrics & Gynecology | Admitting: Obstetrics & Gynecology

## 2018-04-28 ENCOUNTER — Other Ambulatory Visit: Payer: Self-pay

## 2018-04-28 DIAGNOSIS — O30049 Twin pregnancy, dichorionic/diamniotic, unspecified trimester: Secondary | ICD-10-CM

## 2018-04-28 DIAGNOSIS — Z3A27 27 weeks gestation of pregnancy: Secondary | ICD-10-CM

## 2018-04-28 DIAGNOSIS — O0992 Supervision of high risk pregnancy, unspecified, second trimester: Principal | ICD-10-CM | POA: Insufficient documentation

## 2018-04-28 DIAGNOSIS — O09899 Supervision of other high risk pregnancies, unspecified trimester: Secondary | ICD-10-CM

## 2018-04-28 DIAGNOSIS — Z87891 Personal history of nicotine dependence: Secondary | ICD-10-CM | POA: Diagnosis not present

## 2018-04-28 DIAGNOSIS — O368120 Decreased fetal movements, second trimester, not applicable or unspecified: Secondary | ICD-10-CM | POA: Diagnosis present

## 2018-04-28 DIAGNOSIS — O3122X2 Continuing pregnancy after intrauterine death of one fetus or more, second trimester, fetus 2: Secondary | ICD-10-CM

## 2018-04-28 DIAGNOSIS — R87610 Atypical squamous cells of undetermined significance on cytologic smear of cervix (ASC-US): Secondary | ICD-10-CM

## 2018-04-28 DIAGNOSIS — O368130 Decreased fetal movements, third trimester, not applicable or unspecified: Secondary | ICD-10-CM | POA: Diagnosis not present

## 2018-04-28 DIAGNOSIS — O0990 Supervision of high risk pregnancy, unspecified, unspecified trimester: Secondary | ICD-10-CM

## 2018-04-28 NOTE — Final Progress Note (Signed)
Physician Final Progress Note  Patient ID: Margaret BasemanHeather M Given MRN: [id number] DOB/AGE: 09/26/1985 32 y.o.  Admit date: 04/28/2018 Admitting provider: Nadara Mustardobert P Harris, MD Discharge date: 04/28/2018  Admission Diagnoses: Decreased fetal movement  Discharge Diagnoses:  Reactive non-stress test Supervision of high risk pregnancy, antepartum  History of Present Illness: The patient is a 32 y.o. female [phone] at 2199w4d who presents for a decrease in fetal movements that she noticed beginning yesterday (12/15). She is feeling movements, but they are not as frequent as they usually are. She does not have contractions or any other pain. Denies loss of fluid and vaginal bleeding.   Review of Systems: Review of systems negative unless otherwise noted in HPI.   Past Medical History:  Diagnosis Date  . Vaginal Pap smear, abnormal     Past Surgical History:  Procedure Laterality Date  . TOOTH EXTRACTION      No current facility-administered medications on file prior to encounter.    Current Outpatient Medications on File Prior to Encounter  Medication Sig Dispense Refill  . Prenatal Vit-Fe Fumarate-FA (MULTIVITAMIN-PRENATAL) 27-0.8 MG TABS tablet Take 1 tablet by mouth daily at 12 noon.    . metroNIDAZOLE (FLAGYL) 500 MG tablet Take 1 tablet (500 mg total) by mouth 2 (two) times daily. (Patient not taking: Reported on 04/28/2018) 14 tablet 0    No Known Allergies  Social History   Socioeconomic History  . Marital status: Married    Spouse name: Not on file  . Number of children: Not on file  . Years of education: Not on file  . Highest education level: Not on file  Occupational History  . Not on file  Social Needs  . Financial resource strain: Not on file  . Food insecurity:    Worry: Not on file    Inability: Not on file  . Transportation needs:    Medical: Not on file    Non-medical: Not on file  Tobacco Use  . Smoking status: Former Smoker    Packs/day: 0.00    Years:  0.00    Pack years: 0.00    Last attempt to quit: 05/10/2014    Years since quitting: 3.9  . Smokeless tobacco: Never Used  Substance and Sexual Activity  . Alcohol use: No  . Drug use: No  . Sexual activity: Yes    Comment: unsure   Lifestyle  . Physical activity:    Days per week: Not on file    Minutes per session: Not on file  . Stress: Not on file  Relationships  . Social connections:    Talks on phone: Not on file    Gets together: Not on file    Attends religious service: Not on file    Active member of club or organization: Not on file    Attends meetings of clubs or organizations: Not on file    Relationship status: Not on file  . Intimate partner violence:    Fear of current or ex partner: Not on file    Emotionally abused: Not on file    Physically abused: Not on file    Forced sexual activity: Not on file  Other Topics Concern  . Not on file  Social History Narrative  . Not on file    Family history: Negative/unremarkable except as detailed in HPI.    Physical Exam: Ht 5\' 7"  (1.702 m)   Wt 80.7 kg   LMP 10/20/2017 (Approximate)   BMI 27.88 kg/m   Gen:  NAD CV: Regular rate Pulm: No increased work of breathing Pelvic: Deferred Neuro: Alert and oriented Psych: Mood appropriate, affect full  NST  Baseline: 145 Variability: moderate Accelerations: present Decelerations: absent Tocometry: no contractions The patient was monitored for 60+ minutes, fetal heart rate tracing was deemed reactive.   Positive fetal movement appreciated, greater than 20 movements during the monitoring period.  Consults: None  Significant Findings/ Diagnostic Studies: Reactive NST, Category I tracing  Procedures: NST  Discharge Condition: good  Disposition: Discharge disposition: 01-Home or Self Care       Diet: Regular diet  Discharge Activity: Activity as tolerated  Discharge Instructions    Discharge activity:  No Restrictions   Complete by:  As  directed    Discharge diet:  No restrictions   Complete by:  As directed    No sexual activity restrictions   Complete by:  As directed    Notify physician for a general feeling that "something is not right"   Complete by:  As directed    Notify physician for increase or change in vaginal discharge   Complete by:  As directed    Notify physician for intestinal cramps, with or without diarrhea, sometimes described as "gas pain"   Complete by:  As directed    Notify physician for leaking of fluid   Complete by:  As directed    Notify physician for low, dull backache, unrelieved by heat or Tylenol   Complete by:  As directed    Notify physician for menstrual like cramps   Complete by:  As directed    Notify physician for pelvic pressure   Complete by:  As directed    Notify physician for uterine contractions.  These may be painless and feel like the uterus is tightening or the baby is  "balling up"   Complete by:  As directed    Notify physician for vaginal bleeding   Complete by:  As directed    PRETERM LABOR:  Includes any of the follwing symptoms that occur between 20 - [redacted] weeks gestation.  If these symptoms are not stopped, preterm labor can result in preterm delivery, placing your baby at risk   Complete by:  As directed      Allergies as of 04/28/2018   No Known Allergies     Medication List    STOP taking these medications   metroNIDAZOLE 500 MG tablet Commonly known as:  FLAGYL     TAKE these medications   multivitamin-prenatal 27-0.8 MG Tabs tablet Take 1 tablet by mouth daily at 12 noon.        Fetal well-being reassuring. Keep scheduled ROB appointment.  Signed: Oswaldo Conroy, CNM  04/28/2018, 1:58 PM

## 2018-04-28 NOTE — Discharge Summary (Signed)
See Final Progress Note 04/28/2018. 

## 2018-04-28 NOTE — OB Triage Note (Signed)
Patient presented to L&D with complaints of a change in fetal movements since yesterday. States she hasn't been feeling baby move as much which is unusual, states baby is always moving. Denies leaking of fluid, vaginal bleeding or contractions. Denies any pain.  No intercourse within the past 24 hours

## 2018-05-05 ENCOUNTER — Telehealth: Payer: Self-pay

## 2018-05-05 NOTE — Telephone Encounter (Signed)
Pt called after hour nurse 05/04/18 at 6:59pm c/o sore throat, runny/stuffy nose, no fever/cough.  What is safe to take?  [phone] Was adv to use nasal washes, humidifier.  Call back if nasal d/c for 10-14 days, fever over 3d, runny over 10d, SOB, becomes worse. [phone] Pt hasn't been able to get nasal washes.  Adv can also take plain sudafed, plain robitussin/musinex, e.s. tylenol, sip hot beverages/soup, hall's cough drops, sucrets, gargle with warm salt water gargle, push fluids and rest.

## 2018-05-09 ENCOUNTER — Other Ambulatory Visit: Payer: Medicaid Other

## 2018-05-09 ENCOUNTER — Encounter: Payer: Medicaid Other | Admitting: Obstetrics and Gynecology

## 2018-05-14 NOTE — L&D Delivery Note (Signed)
Delivery Note Primary OB: [facility] Delivery Physician: Annamarie Major, MD Gestational Age: Full term Antepartum complications: 14 week fetal demise of one twin Intrapartum complications: None  A viable Female was delivered via vertex perentation.  Apgars:9 ,9  Weight:  pending .   Placenta status: spontaneous and Intact.  Cord: 3+ vessels;  with the following complications: nuchal.  Noted probable tissue for twin demise (placental) imbedded within membranes.   Anesthesia:  epidural Episiotomy:  none Lacerations:  none Suture Repair: none Est. Blood Loss (mL):  300 mL  Mom to postpartum.  Baby to Couplet care / Skin to Skin.  Annamarie Major, MD, [name], [facility] 07/22/2018  5:27 PM [phone]

## 2018-05-19 ENCOUNTER — Ambulatory Visit (INDEPENDENT_AMBULATORY_CARE_PROVIDER_SITE_OTHER): Payer: Medicaid Other | Admitting: Obstetrics and Gynecology

## 2018-05-19 ENCOUNTER — Other Ambulatory Visit: Payer: Medicaid Other

## 2018-05-19 ENCOUNTER — Encounter: Payer: Self-pay | Admitting: Obstetrics and Gynecology

## 2018-05-19 VITALS — BP 118/74 | Wt 192.0 lb

## 2018-05-19 DIAGNOSIS — O09899 Supervision of other high risk pregnancies, unspecified trimester: Secondary | ICD-10-CM

## 2018-05-19 DIAGNOSIS — Z3A30 30 weeks gestation of pregnancy: Secondary | ICD-10-CM

## 2018-05-19 DIAGNOSIS — O3122X2 Continuing pregnancy after intrauterine death of one fetus or more, second trimester, fetus 2: Secondary | ICD-10-CM

## 2018-05-19 DIAGNOSIS — O0990 Supervision of high risk pregnancy, unspecified, unspecified trimester: Secondary | ICD-10-CM

## 2018-05-19 DIAGNOSIS — Z131 Encounter for screening for diabetes mellitus: Secondary | ICD-10-CM

## 2018-05-19 DIAGNOSIS — Z23 Encounter for immunization: Secondary | ICD-10-CM

## 2018-05-19 NOTE — Addendum Note (Signed)
Addended by: Liliane Shi on: 05/19/2018 11:30 AM   Modules accepted: Orders

## 2018-05-19 NOTE — Progress Notes (Signed)
Routine Prenatal Care Visit  Subjective  Margaret BasemanHeather M Given is a 33 y.o. [phone] at 8633w4d being seen today for ongoing prenatal care.  She is currently monitored for the following issues for this high-risk pregnancy and has Lump or mass in breast; Supervision of high risk pregnancy, antepartum; Abnormal Pap smear of cervix; Dichorionic diamniotic twin pregnancy, antepartum; Short interval between pregnancies complicating pregnancy, antepartum; Twin pregnancy with single intrauterine death, second trimester, fetus 2; and Indication for care in labor and delivery, antepartum on their problem list.  ----------------------------------------------------------------------------------- Patient reports no complaints.   Contractions: Irritability. Vag. Bleeding: None.  Movement: Present. Denies leaking of fluid.  ----------------------------------------------------------------------------------- The following portions of the patient's history were reviewed and updated as appropriate: allergies, current medications, past family history, past medical history, past social history, past surgical history and problem list. Problem list updated.   Objective  Blood pressure 118/74, weight 192 lb (87.1 kg), last menstrual period 10/20/2017, unknown if currently breastfeeding. Pregravid weight 135 lb (61.2 kg) Total Weight Gain 57 lb (25.9 kg) Urinalysis: Urine Protein    Urine Glucose    Fetal Status: Fetal Heart Rate (bpm): 155 Fundal Height: 30 cm Movement: Present     General:  Alert, oriented and cooperative. Patient is in no acute distress.  Skin: Skin is warm and dry. No rash noted.   Cardiovascular: Normal heart rate noted  Respiratory: Normal respiratory effort, no problems with respiration noted  Abdomen: Soft, gravid, appropriate for gestational age. Pain/Pressure: Absent     Pelvic:  Cervical exam deferred        Extremities: Normal range of motion.  Edema: None  Mental Status: Normal mood and  affect. Normal behavior. Normal judgment and thought content.   Assessment   33 y.o. [id number] at 6233w4d by  07/24/2018, by Ultrasound presenting for routine prenatal visit  Plan   Pregnancy#4 Problems (from 10/20/17 to present)    Problem Noted Resolved   Twin pregnancy with single intrauterine death, second trimester, fetus 2 02/12/2018 by Conard NovakJackson, Sharnika Binney D, MD No   Overview Signed 02/12/2018 12:42 PM by Conard NovakJackson, Tyasia Packard D, MD    Diagnosed 02/12/18 [ ]  immediate referral to [facility] ([facility] I have tried to arrange an emergent consultation with [facility] for 10/3.  Patient to be called today or tomorrow to arrange appointment. [place] in [place] or [place].       Short interval between pregnancies complicating pregnancy, antepartum 12/03/2017 by Vena AustriaStaebler, Andreas, MD No   Supervision of high risk pregnancy, antepartum 08/13/2016 by Farrel ConnersGutierrez, Colleen, CNM No   Overview Addendum 03/12/2018 11:16 AM by Vena AustriaStaebler, Andreas, MD     Clinic [facility] Prenatal Labs  Dating LMP = 6 week US Di/Di twin pregnancy Blood type: A/Positive/-- (12/27 0000)   Genetic Screen NIPS: Normal XX SMA neg, CF, neg, and Fragile-X neg Antibody:Negative (12/27 0000)  [facility] Rubella: Immune (12/27 0000)  GTT  RPR: Nonreactive (12/27 0000)   Flu vaccine 01/28/18 HBsAg: Negative (12/27 0000)   TDaP vaccine                                               Rhogam: N/A HIV: Non-reactive (12/27 0000)   Baby Food Breast  GBS: (For PCN allergy, check sensitivities)  Contraception  Pap: 05/09/16 ASCUS with +HRHPV  Circumcision  LGSIL pap HPV positive, 16-18 negative 11/27/2017 [ ]  Colposcopy postpartum or outside of first trimester  Pediatrician    Support Person            Abnormal Pap smear of cervix 08/13/2016 by Farrel Conners, CNM No    Overview Addendum 01/28/2018  2:49 PM by Vena Austria, MD    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  11/27/2017 LGSIL HPV positive 16/18 neg / neg [ ]  colpo outside of first trimester (preferred) or postpartum (acceptable)      Abnormal Pap smear of vagina 01/28/2018 by Vena Austria, MD 01/28/2018 by Vena Austria, MD   Overview Signed 01/28/2018  2:46 PM by Vena Austria, MD    [ ]  Colposcopy postpartum          Preterm labor symptoms and general obstetric precautions including but not limited to vaginal bleeding, contractions, leaking of fluid and fetal movement were reviewed in detail with the patient. Please refer to After Visit Summary for other counseling recommendations.   - 28 week labs today  Return in about 2 weeks (around 06/02/2018) for Routine Prenatal Appointment.  Thomasene Mohair, MD, [name], [facility] 05/19/2018 10:31 AM

## 2018-05-20 LAB — 28 WEEK RH+PANEL
Basophils Absolute: 0 10*3/uL (ref 0.0–0.2)
Basos: 0 %
EOS (ABSOLUTE): 0.4 10*3/uL (ref 0.0–0.4)
Eos: 3 %
Gestational Diabetes Screen: 103 mg/dL (ref 65–139)
HIV Screen 4th Generation wRfx: NONREACTIVE
Hematocrit: 34.6 % (ref 34.0–46.6)
Hemoglobin: 11.9 g/dL (ref 11.1–15.9)
Immature Grans (Abs): 0.1 10*3/uL (ref 0.0–0.1)
Immature Granulocytes: 1 %
LYMPHS: 17 %
Lymphocytes Absolute: 1.9 10*3/uL (ref 0.7–3.1)
MCH: 30.9 pg (ref 26.6–33.0)
MCHC: 34.4 g/dL (ref 31.5–35.7)
MCV: 90 fL (ref 79–97)
Monocytes Absolute: 0.8 10*3/uL (ref 0.1–0.9)
Monocytes: 7 %
NEUTROS ABS: 7.9 10*3/uL — AB (ref 1.4–7.0)
Neutrophils: 72 %
Platelets: 238 10*3/uL (ref 150–450)
RBC: 3.85 x10E6/uL (ref 3.77–5.28)
RDW: 12.9 % (ref 11.7–15.4)
RPR Ser Ql: NONREACTIVE
WBC: 11 10*3/uL — ABNORMAL HIGH (ref 3.4–10.8)

## 2018-05-27 ENCOUNTER — Ambulatory Visit (INDEPENDENT_AMBULATORY_CARE_PROVIDER_SITE_OTHER): Payer: Medicaid Other | Admitting: Certified Nurse Midwife

## 2018-05-27 ENCOUNTER — Encounter: Payer: Self-pay | Admitting: Certified Nurse Midwife

## 2018-05-27 VITALS — BP 100/60 | Wt 194.0 lb

## 2018-05-27 DIAGNOSIS — Z3A31 31 weeks gestation of pregnancy: Secondary | ICD-10-CM

## 2018-05-27 DIAGNOSIS — N760 Acute vaginitis: Secondary | ICD-10-CM

## 2018-05-27 DIAGNOSIS — B9689 Other specified bacterial agents as the cause of diseases classified elsewhere: Secondary | ICD-10-CM

## 2018-05-27 DIAGNOSIS — N898 Other specified noninflammatory disorders of vagina: Secondary | ICD-10-CM

## 2018-05-27 MED ORDER — METRONIDAZOLE 500 MG PO TABS: 500.0000 mg | ORAL_TABLET | Freq: Two times a day (BID) | ORAL | 0 refills | Status: AC

## 2018-05-27 NOTE — Progress Notes (Signed)
Work in at [id number] with vaginal odor x 1 week. No vulvar itching or irritation. Last treated for BV 1-2 months ago. She thinks she has same symptoms. Baby active. External/BUS: no inflammation or lesions Vagina: white homogenous discharge Wet prep: positive for a few clue cells, negative for hyphae, negative for WBCs, negative for Trichimonads Ph 4.5 A: mild bacterial vaginosis P: Flagyl 500 mgm BID x 7 days (no alcohol), take with food ROB as scheduled.  Farrel Connersolleen Shakthi Scipio, CNM

## 2018-06-02 ENCOUNTER — Encounter: Payer: Medicaid Other | Admitting: Obstetrics & Gynecology

## 2018-06-03 ENCOUNTER — Ambulatory Visit (INDEPENDENT_AMBULATORY_CARE_PROVIDER_SITE_OTHER): Payer: Medicaid Other | Admitting: Obstetrics & Gynecology

## 2018-06-03 VITALS — BP 110/60 | HR 93 | Wt 198.0 lb

## 2018-06-03 DIAGNOSIS — O3122X2 Continuing pregnancy after intrauterine death of one fetus or more, second trimester, fetus 2: Secondary | ICD-10-CM

## 2018-06-03 DIAGNOSIS — O0990 Supervision of high risk pregnancy, unspecified, unspecified trimester: Secondary | ICD-10-CM

## 2018-06-03 DIAGNOSIS — Z3A32 32 weeks gestation of pregnancy: Secondary | ICD-10-CM

## 2018-06-03 DIAGNOSIS — O09899 Supervision of other high risk pregnancies, unspecified trimester: Secondary | ICD-10-CM

## 2018-06-03 LAB — POCT URINALYSIS DIPSTICK OB
Glucose, UA: NEGATIVE
POC,PROTEIN,UA: NEGATIVE

## 2018-06-03 NOTE — Progress Notes (Signed)
Prenatal Visit Note Date: 06/03/2018 Clinic: [facility]  Subjective:  Margaret [name] is a 33 y.o. [phone] at [redacted]w[redacted]d being seen today for ongoing prenatal care.  She is currently monitored for the following issues for this high-risk pregnancy and has Lump or mass in breast; Supervision of high risk pregnancy, antepartum; Abnormal Pap smear of cervix; Short interval between pregnancies complicating pregnancy, antepartum; Twin pregnancy with single intrauterine death, second trimester, fetus 2; and Indication for care in labor and delivery, antepartum on their problem list.  Patient reports RUQ rib pain, watery discharge one occasion a few days ago withoot fever pain or ctxs, heart racing at times.  No VB, Ctxs..    .  .  Movement: Present. Denies leaking of fluid.   The following portions of the patient's history were reviewed and updated as appropriate: allergies, current medications, past family history, past medical history, past social history, past surgical history and problem list. Problem list updated.  Objective:   Vitals:   06/03/18 0916  BP: 110/60  Pulse: 93  Weight: 198 lb (89.8 kg)    Fetal Status:     Movement: Present     General:  Alert, oriented and cooperative. Patient is in no acute distress.  Skin: Skin is warm and dry. No rash noted.   Cardiovascular: Normal heart rate noted  Respiratory: Normal respiratory effort, no problems with respiration noted  Abdomen: Soft, gravid, appropriate for gestational age. Pain/Pressure: Present     Pelvic:  Cervical exam deferred        Extremities: Normal range of motion.     Mental Status: Normal mood and affect. Normal behavior. Normal judgment and thought content.   Urinalysis:      Assessment and Plan:  Pregnancy: G4P3003 at [redacted]w[redacted]d  1. [redacted] weeks gestation of pregnancy - POC Urinalysis Dipstick OB  2. Supervision of high risk pregnancy, antepartum - US OB Follow Up; Future for growth, AFI  3. Short interval between  pregnancies complicating pregnancy, antepartum  4. Twin pregnancy with single intrauterine death, second trimester, fetus 2 - US OB Follow Up  Preterm labor symptoms and general obstetric precautions including but not limited to vaginal bleeding, contractions, leaking of fluid and fetal movement were reviewed in detail with the patient. Please refer to After Visit Summary for other counseling recommendations.  Return in about 1 week (around 06/10/2018) for ROB w Growth [place].  Monitor for further LOF; likely urinary incontinence.  Consider fern testing and AFI if continuous leakage. Hydration and pace activity to monitor heart racing sx's. RUQ pain seems rib pain/growth pain.  No s/sx gall bladder or liver concern PNV, [facility]  Annamarie Major, MD, [name], [facility] 06/03/2018  9:52 AM

## 2018-06-03 NOTE — Patient Instructions (Signed)
Third Trimester of Pregnancy The third trimester is from week 28 through week 40 (months 7 through 9). The third trimester is a time when the unborn baby (fetus) is growing rapidly. At the end of the ninth month, the fetus is about 20 inches in length and weighs 6-10 pounds. Body changes during your third trimester Your body will continue to go through many changes during pregnancy. The changes vary from woman to woman. During the third trimester:  Your weight will continue to increase. You can expect to gain 25-35 pounds (11-16 kg) by the end of the pregnancy.  You may begin to get stretch marks on your hips, abdomen, and breasts.  You may urinate more often because the fetus is moving lower into your pelvis and pressing on your bladder.  You may develop or continue to have heartburn. This is caused by increased hormones that slow down muscles in the digestive tract.  You may develop or continue to have constipation because increased hormones slow digestion and cause the muscles that push waste through your intestines to relax.  You may develop hemorrhoids. These are swollen veins (varicose veins) in the rectum that can itch or be painful.  You may develop swollen, bulging veins (varicose veins) in your legs.  You may have increased body aches in the pelvis, back, or thighs. This is due to weight gain and increased hormones that are relaxing your joints.  You may have changes in your hair. These can include thickening of your hair, rapid growth, and changes in texture. Some women also have hair loss during or after pregnancy, or hair that feels dry or thin. Your hair will most likely return to normal after your baby is born.  Your breasts will continue to grow and they will continue to become tender. A yellow fluid (colostrum) may leak from your breasts. This is the first milk you are producing for your baby.  Your belly button may stick out.  You may notice more swelling in your hands,  face, or ankles.  You may have increased tingling or numbness in your hands, arms, and legs. The skin on your belly may also feel numb.  You may feel short of breath because of your expanding uterus.  You may have more problems sleeping. This can be caused by the size of your belly, increased need to urinate, and an increase in your body's metabolism.  You may notice the fetus "dropping," or moving lower in your abdomen (lightening).  You may have increased vaginal discharge.  You may notice your joints feel loose and you may have pain around your pelvic bone. What to expect at prenatal visits You will have prenatal exams every 2 weeks until week 36. Then you will have weekly prenatal exams. During a routine prenatal visit:  You will be weighed to make sure you and the baby are growing normally.  Your blood pressure will be taken.  Your abdomen will be measured to track your baby's growth.  The fetal heartbeat will be listened to.  Any test results from the previous visit will be discussed.  You may have a cervical check near your due date to see if your cervix has softened or thinned (effaced).  You will be tested for Group B streptococcus. This happens between 35 and 37 weeks. Your health care provider may ask you:  What your birth plan is.  How you are feeling.  If you are feeling the baby move.  If you have had any abnormal   symptoms, such as leaking fluid, bleeding, severe headaches, or abdominal cramping.  If you are using any tobacco products, including cigarettes, chewing tobacco, and electronic cigarettes.  If you have any questions. Other tests or screenings that may be performed during your third trimester include:  Blood tests that check for low iron levels (anemia).  Fetal testing to check the health, activity level, and growth of the fetus. Testing is done if you have certain medical conditions or if there are problems during the pregnancy.  Nonstress test  (NST). This test checks the health of your baby to make sure there are no signs of problems, such as the baby not getting enough oxygen. During this test, a belt is placed around your belly. The baby is made to move, and its heart rate is monitored during movement. What is false labor? False labor is a condition in which you feel small, irregular tightenings of the muscles in the womb (contractions) that usually go away with rest, changing position, or drinking water. These are called Braxton Hicks contractions. Contractions may last for hours, days, or even weeks before true labor sets in. If contractions come at regular intervals, become more frequent, increase in intensity, or become painful, you should see your health care provider. What are the signs of labor?  Abdominal cramps.  Regular contractions that start at 10 minutes apart and become stronger and more frequent with time.  Contractions that start on the top of the uterus and spread down to the lower abdomen and back.  Increased pelvic pressure and dull back pain.  A watery or bloody mucus discharge that comes from the vagina.  Leaking of amniotic fluid. This is also known as your "water breaking." It could be a slow trickle or a gush. Let your health care provider know if it has a color or strange odor. If you have any of these signs, call your health care provider right away, even if it is before your due date. Follow these instructions at home: Medicines  Follow your health care provider's instructions regarding medicine use. Specific medicines may be either safe or unsafe to take during pregnancy.  Take a prenatal vitamin that contains at least 600 micrograms (mcg) of folic acid.  If you develop constipation, try taking a stool softener if your health care provider approves. Eating and drinking   Eat a balanced diet that includes fresh fruits and vegetables, whole grains, good sources of protein such as meat, eggs, or tofu,  and low-fat dairy. Your health care provider will help you determine the amount of weight gain that is right for you.  Avoid raw meat and uncooked cheese. These carry germs that can cause birth defects in the baby.  If you have low calcium intake from food, talk to your health care provider about whether you should take a daily calcium supplement.  Eat four or five small meals rather than three large meals a day.  Limit foods that are high in fat and processed sugars, such as fried and sweet foods.  To prevent constipation: ? Drink enough fluid to keep your urine clear or pale yellow. ? Eat foods that are high in fiber, such as fresh fruits and vegetables, whole grains, and beans. Activity  Exercise only as directed by your health care provider. Most women can continue their usual exercise routine during pregnancy. Try to exercise for 30 minutes at least 5 days a week. Stop exercising if you experience uterine contractions.  Avoid heavy lifting.  Do   not exercise in extreme heat or humidity, or at high altitudes.  Wear low-heel, comfortable shoes.  Practice good posture.  You may continue to have sex unless your health care provider tells you otherwise. Relieving pain and discomfort  Take frequent breaks and rest with your legs elevated if you have leg cramps or low back pain.  Take warm sitz baths to soothe any pain or discomfort caused by hemorrhoids. Use hemorrhoid cream if your health care provider approves.  Wear a good support bra to prevent discomfort from breast tenderness.  If you develop varicose veins: ? Wear support pantyhose or compression stockings as told by your healthcare provider. ? Elevate your feet for 15 minutes, 3-4 times a day. Prenatal care  Write down your questions. Take them to your prenatal visits.  Keep all your prenatal visits as told by your health care provider. This is important. Safety  Wear your seat belt at all times when driving.  Make  a list of emergency phone numbers, including numbers for family, friends, the hospital, and police and fire departments. General instructions  Avoid cat litter boxes and soil used by cats. These carry germs that can cause birth defects in the baby. If you have a cat, ask someone to clean the litter box for you.  Do not travel far distances unless it is absolutely necessary and only with the approval of your health care provider.  Do not use hot tubs, steam rooms, or saunas.  Do not drink alcohol.  Do not use any products that contain nicotine or tobacco, such as cigarettes and e-cigarettes. If you need help quitting, ask your health care provider.  Do not use any medicinal herbs or unprescribed drugs. These chemicals affect the formation and growth of the baby.  Do not douche or use tampons or scented sanitary pads.  Do not cross your legs for long periods of time.  To prepare for the arrival of your baby: ? Take prenatal classes to understand, practice, and ask questions about labor and delivery. ? Make a trial run to the hospital. ? Visit the hospital and tour the maternity area. ? Arrange for maternity or paternity leave through employers. ? Arrange for family and friends to take care of pets while you are in the hospital. ? Purchase a rear-facing car seat and make sure you know how to install it in your car. ? Pack your hospital bag. ? Prepare the baby's nursery. Make sure to remove all pillows and stuffed animals from the baby's crib to prevent suffocation.  Visit your dentist if you have not gone during your pregnancy. Use a soft toothbrush to brush your teeth and be gentle when you floss. Contact a health care provider if:  You are unsure if you are in labor or if your water has broken.  You become dizzy.  You have mild pelvic cramps, pelvic pressure, or nagging pain in your abdominal area.  You have lower back pain.  You have persistent nausea, vomiting, or  diarrhea.  You have an unusual or bad smelling vaginal discharge.  You have pain when you urinate. Get help right away if:  Your water breaks before 37 weeks.  You have regular contractions less than 5 minutes apart before 37 weeks.  You have a fever.  You are leaking fluid from your vagina.  You have spotting or bleeding from your vagina.  You have severe abdominal pain or cramping.  You have rapid weight loss or weight gain.  You have   shortness of breath with chest pain.  You notice sudden or extreme swelling of your face, hands, ankles, feet, or legs.  Your baby makes fewer than 10 movements in 2 hours.  You have severe headaches that do not go away when you take medicine.  You have vision changes. Summary  The third trimester is from week 28 through week 40, months 7 through 9. The third trimester is a time when the unborn baby (fetus) is growing rapidly.  During the third trimester, your discomfort may increase as you and your baby continue to gain weight. You may have abdominal, leg, and back pain, sleeping problems, and an increased need to urinate.  During the third trimester your breasts will keep growing and they will continue to become tender. A yellow fluid (colostrum) may leak from your breasts. This is the first milk you are producing for your baby.  False labor is a condition in which you feel small, irregular tightenings of the muscles in the womb (contractions) that eventually go away. These are called Braxton Hicks contractions. Contractions may last for hours, days, or even weeks before true labor sets in.  Signs of labor can include: abdominal cramps; regular contractions that start at 10 minutes apart and become stronger and more frequent with time; watery or bloody mucus discharge that comes from the vagina; increased pelvic pressure and dull back pain; and leaking of amniotic fluid. This information is not intended to replace advice given to you by your  health care provider. Make sure you discuss any questions you have with your health care provider. Document Released: 04/24/2001 Document Revised: 06/05/2016 Document Reviewed: 06/05/2016 [employer] Interactive Patient Education  2019 [employer]  

## 2018-06-11 ENCOUNTER — Ambulatory Visit (INDEPENDENT_AMBULATORY_CARE_PROVIDER_SITE_OTHER): Payer: Medicaid Other | Admitting: Advanced Practice Midwife

## 2018-06-11 ENCOUNTER — Encounter: Payer: Self-pay | Admitting: Advanced Practice Midwife

## 2018-06-11 ENCOUNTER — Ambulatory Visit (INDEPENDENT_AMBULATORY_CARE_PROVIDER_SITE_OTHER): Payer: Medicaid Other

## 2018-06-11 VITALS — BP 118/74 | Wt 200.0 lb

## 2018-06-11 DIAGNOSIS — O0990 Supervision of high risk pregnancy, unspecified, unspecified trimester: Secondary | ICD-10-CM

## 2018-06-11 DIAGNOSIS — Z3A33 33 weeks gestation of pregnancy: Secondary | ICD-10-CM

## 2018-06-11 DIAGNOSIS — O3123X2 Continuing pregnancy after intrauterine death of one fetus or more, third trimester, fetus 2: Secondary | ICD-10-CM | POA: Diagnosis not present

## 2018-06-11 DIAGNOSIS — O3122X2 Continuing pregnancy after intrauterine death of one fetus or more, second trimester, fetus 2: Secondary | ICD-10-CM

## 2018-06-11 DIAGNOSIS — O3123X0 Continuing pregnancy after intrauterine death of one fetus or more, third trimester, not applicable or unspecified: Secondary | ICD-10-CM

## 2018-06-11 NOTE — Progress Notes (Signed)
Routine Prenatal Care Visit  Subjective  Margaret [name] Given is a 33 y.o. [phone] at [redacted]w[redacted]d being seen today for ongoing prenatal care.  She is currently monitored for the following issues for this high-risk pregnancy and has Lump or mass in breast; Supervision of high risk pregnancy, antepartum; Abnormal Pap smear of cervix; Dichorionic diamniotic twin pregnancy in second trimester; Short interval between pregnancies complicating pregnancy, antepartum; Twin pregnancy with single intrauterine death, second trimester, fetus 2; and Indication for care in labor and delivery, antepartum on their problem list.  ----------------------------------------------------------------------------------- Patient reports no complaints.   Contractions: Not present. Vag. Bleeding: None.  Movement: Present. Denies leaking of fluid.  ----------------------------------------------------------------------------------- The following portions of the patient's history were reviewed and updated as appropriate: allergies, current medications, past family history, past medical history, past social history, past surgical history and problem list. Problem list updated.   Objective  Blood pressure 118/74, weight 200 lb (90.7 kg), last menstrual period 10/20/2017 Pregravid weight 135 lb (61.2 kg) Total Weight Gain 65 lb (29.5 kg) Urinalysis: Urine Protein    Urine Glucose    Fetal Status: Fetal Heart Rate (bpm): 148 Fundal Height: 34 cm Movement: Present  Presentation: Vertex  Growth scan: 43.8%, 5 pounds 0 ounces, AFI 10.9  General:  Alert, oriented and cooperative. Patient is in no acute distress.  Skin: Skin is warm and dry. No rash noted.   Cardiovascular: Normal heart rate noted  Respiratory: Normal respiratory effort, no problems with respiration noted  Abdomen: Soft, gravid, appropriate for gestational age. Pain/Pressure: Absent     Pelvic:  Cervical exam deferred        Extremities: Normal range of motion.  Edema:  None  Mental Status: Normal mood and affect. Normal behavior. Normal judgment and thought content.   Assessment   33 y.o. [id number] at [redacted]w[redacted]d by  07/24/2018, by Ultrasound presenting for routine prenatal visit  Plan   Pregnancy#4 Problems (from 10/20/17 to present)    Problem Noted Resolved   Twin pregnancy with single intrauterine death, second trimester, fetus 2 02/12/2018 by Conard Novak, MD No   Overview Addendum 05/27/2018  4:54 PM by Farrel Conners, CNM    Diagnosed 02/12/18 [ ]  immediate referral to [facility] (patient already has requested to have care transferred to [facility] for delivery). Patient had an appointment at [facility] and had initially planned to deliver there, but has changed her mind and will be delivering with [facility] at [facility].      Dichorionic diamniotic twin pregnancy in second trimester 12/03/2017 by Vena Austria, MD No   Overview Addendum 06/11/2018  8:40 AM by Tresea Mall, CNM    [X]  additional 1g folic acid  [X]  baseline preeclampsia labs [ ]  ASA after 1st trimester [ ]  Monthly growth scan [ ]  Delivery planning      Short interval between pregnancies complicating pregnancy, antepartum 12/03/2017 by Vena Austria, MD No   Supervision of high risk pregnancy, antepartum 08/13/2016 by Farrel Conners, CNM No   Overview Addendum 05/20/2018  8:56 AM by Vena Austria, MD     Clinic [facility] Prenatal Labs  Dating LMP = 6 week [place] Di/Di twin pregnancy Blood type: A/Positive/-- (12/27 0000)   Genetic Screen NIPS: Normal XX SMA neg, CF, neg, and Fragile-X neg Antibody:Negative (12/27 0000)  Anatomic [place] Incomplete spine views at [facility], completed at 20 weeks here at [facility] Rubella: Immune (12/27 0000)  GTT 111 RPR: Nonreactive (12/27 0000)   Flu vaccine 01/28/18 HBsAg: Negative (12/27 0000)   TDaP  vaccine                                               Rhogam: N/A HIV: Non-reactive (12/27 0000)   Baby Food Breast                                         GBS: (For  PCN allergy, check sensitivities)  Contraception  Pap: 05/09/16 ASCUS with +HRHPV  Circumcision  LGSIL pap HPV positive, 16-18 negative 11/27/2017 [ ]  Colposcopy postpartum or outside of first trimester  Pediatrician    Support Person            Abnormal Pap smear of cervix 08/13/2016 by Farrel Conners, CNM No   Overview Addendum 01/28/2018  2:49 PM by Vena Austria, MD    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  11/27/2017 LGSIL HPV positive 16/18 neg / neg [ ]  colpo outside of first trimester (preferred) or postpartum (acceptable)      Abnormal Pap smear of vagina 01/28/2018 by Vena Austria, MD 01/28/2018 by Vena Austria, MD   Overview Signed 01/28/2018  2:46 PM by Vena Austria, MD    [ ]  Colposcopy postpartum          Preterm labor symptoms and general obstetric precautions including but not limited to vaginal bleeding, contractions, leaking of fluid and fetal movement were reviewed in detail with the patient.    Return in about 15 days (around 06/26/2018) for rob.  Tresea Mall, CNM 06/11/2018 8:51 AM

## 2018-06-11 NOTE — Progress Notes (Signed)
No vb. No lof. Has had some more dizziness.

## 2018-06-25 ENCOUNTER — Other Ambulatory Visit: Payer: Self-pay | Admitting: Certified Nurse Midwife

## 2018-06-26 ENCOUNTER — Ambulatory Visit (INDEPENDENT_AMBULATORY_CARE_PROVIDER_SITE_OTHER): Payer: Medicaid Other | Admitting: Obstetrics & Gynecology

## 2018-06-26 ENCOUNTER — Telehealth: Payer: Self-pay

## 2018-06-26 VITALS — BP 100/60 | Wt 206.0 lb

## 2018-06-26 DIAGNOSIS — O3122X2 Continuing pregnancy after intrauterine death of one fetus or more, second trimester, fetus 2: Secondary | ICD-10-CM

## 2018-06-26 DIAGNOSIS — O0990 Supervision of high risk pregnancy, unspecified, unspecified trimester: Secondary | ICD-10-CM

## 2018-06-26 DIAGNOSIS — O09899 Supervision of other high risk pregnancies, unspecified trimester: Secondary | ICD-10-CM

## 2018-06-26 DIAGNOSIS — Z3A36 36 weeks gestation of pregnancy: Secondary | ICD-10-CM

## 2018-06-26 NOTE — Progress Notes (Signed)
  Subjective  Fetal Movement? yes Contractions? no Leaking Fluid? no Vaginal Bleeding? no  Objective  BP 100/60   Wt 206 lb (93.4 kg)   LMP 10/20/2017 (Approximate)   BMI 32.26 kg/m  General: NAD Pumonary: no increased work of breathing Abdomen: gravid, non-tender Extremities: no edema Psychiatric: mood appropriate, affect full SVE FT/30/-3 Assessment  32 y.o. [id number] at [redacted]w[redacted]d by  07/24/2018, by Ultrasound presenting for routine prenatal visit  Plan   Problem List Items Addressed This Visit      Other   Supervision of high risk pregnancy, antepartum   Short interval between pregnancies complicating pregnancy, antepartum   Twin pregnancy with single intrauterine death, second trimester, fetus 2    Other Visit Diagnoses    [redacted] weeks gestation of pregnancy    -  Primary   Relevant Orders   Culture, beta strep (group b only)    Prior macrosomia and IOL for pst dates w vag delivery    Monitor exams, sx's of labor    IOL after 39 weeks discussed if favorable cervix PNV, FMC, Labor precautions Breast feeding BTL vs IUD, husband still unsure as to permanacy of BTL  Annamarie Major, MD, [name], [facility] 06/26/2018  9:31 AM

## 2018-06-26 NOTE — Patient Instructions (Signed)
Group B Streptococcus Infection During Pregnancy  Group B Streptococcus (GBS) is a type of bacteria (Streptococcus agalactiae) that is often found in healthy people, commonly in the rectum, vagina, and intestines. In people who are healthy and not pregnant, the bacteria rarely cause serious illness or complications. However, women who test positive for GBS during pregnancy can pass the bacteria to their baby during childbirth, which can cause serious infection in the baby after birth. Women with GBS may also have infections during their pregnancy or immediately after childbirth, such as such as urinary tract infections (UTIs) or infections of the uterus (uterine infections). Having GBS also increases a woman's risk of complications during pregnancy, such as early (preterm) labor or delivery, miscarriage, or stillbirth. Routine testing (screening) for GBS is recommended for all pregnant women. What increases the risk? You may have a higher risk for GBS infection during pregnancy if you had one during a past pregnancy. What are the signs or symptoms? In most cases, GBS infection does not cause symptoms in pregnant women. Signs and symptoms of a possible GBS-related infection may include:  Labor starting before the 37th week of pregnancy.  A UTI or bladder infection, which may cause: ? Fever. ? Pain or burning during urination. ? Frequent urination.  Fever during labor, along with: ? Bad-smelling discharge. ? Uterine tenderness. ? Rapid heartbeat in the mother, baby, or both. Rare but serious symptoms of a possible GBS-related infection in women include:  Blood infection (septicemia). This may cause fever, chills, or confusion.  Lung infection (pneumonia). This may cause fever, chills, cough, rapid breathing, difficulty breathing, or chest pain.  Bone, joint, skin, or soft tissue infection. How is this diagnosed? You may be screened for GBS between week 35 and week 37 of your pregnancy. If  you have symptoms of preterm labor, you may be screened earlier. This condition is diagnosed based on lab test results from:  A swab of fluid from the vagina and rectum.  A urine sample. How is this treated? This condition is treated with antibiotic medicine. When you go into labor, or as soon as your water breaks (your membranes rupture), you will be given antibiotics through an IV tube. Antibiotics will continue until after you give birth. If you are having a cesarean delivery, you do not need antibiotics unless your membranes have already ruptured. Follow these instructions at home:  Take over-the-counter and prescription medicines only as told by your health care provider.  Take your antibiotic medicine as told by your health care provider. Do not stop taking the antibiotic even if you start to feel better.  Keep all pre-birth (prenatal) visits and follow-up visits as told by your health care provider. This is important. Contact a health care provider if:  You have pain or burning when you urinate.  You have to urinate frequently.  You have a fever or chills.  You develop a bad-smelling vaginal discharge. Get help right away if:  Your membranes rupture.  You go into labor.  You have severe pain in your abdomen.  You have difficulty breathing.  You have chest pain. This information is not intended to replace advice given to you by your health care provider. Make sure you discuss any questions you have with your health care provider. Document Released: 08/07/2007 Document Revised: 11/25/2015 Document Reviewed: 11/24/2015 Elsevier Interactive Patient Education  2019 Elsevier Inc.  

## 2018-06-26 NOTE — Telephone Encounter (Signed)
Pt called after hour nurse yesterday at 5:32pm wanting to know what kind of sleeping meds she can take.  She is 36wks preg and has had sleeping issues for about a week .  After hour nurse paged CLG who adv benadryl 25mg  at HS. Pt also has appt today.

## 2018-07-01 LAB — CULTURE, BETA STREP (GROUP B ONLY): STREP GP B CULTURE: NEGATIVE

## 2018-07-03 ENCOUNTER — Encounter: Payer: Self-pay | Admitting: Obstetrics and Gynecology

## 2018-07-03 ENCOUNTER — Other Ambulatory Visit: Payer: Self-pay | Admitting: Obstetrics and Gynecology

## 2018-07-03 ENCOUNTER — Ambulatory Visit (INDEPENDENT_AMBULATORY_CARE_PROVIDER_SITE_OTHER): Payer: Medicaid Other | Admitting: Obstetrics and Gynecology

## 2018-07-03 VITALS — BP 118/70 | Wt 204.0 lb

## 2018-07-03 DIAGNOSIS — O3122X2 Continuing pregnancy after intrauterine death of one fetus or more, second trimester, fetus 2: Secondary | ICD-10-CM

## 2018-07-03 DIAGNOSIS — F5101 Primary insomnia: Secondary | ICD-10-CM

## 2018-07-03 DIAGNOSIS — O0990 Supervision of high risk pregnancy, unspecified, unspecified trimester: Secondary | ICD-10-CM

## 2018-07-03 DIAGNOSIS — Z3A37 37 weeks gestation of pregnancy: Secondary | ICD-10-CM

## 2018-07-03 MED ORDER — ZOLPIDEM TARTRATE 5 MG PO TABS: 5.0000 mg | ORAL_TABLET | Freq: Every evening | ORAL | 1 refills | Status: DC | PRN

## 2018-07-03 NOTE — Progress Notes (Signed)
ROB C/o having trouble sleeping, gets about 2 to 3 hours a night, headaches on Monday and Tuesday, and some heartburn

## 2018-07-03 NOTE — Progress Notes (Signed)
Routine Prenatal Care Visit  Subjective  Margaret [name] Given is a 33 y.o. [phone] at [redacted]w[redacted]d being seen today for ongoing prenatal care.  She is currently monitored for the following issues for this high-risk pregnancy and has Lump or mass in breast; Supervision of high risk pregnancy, antepartum; Abnormal Pap smear of cervix; Dichorionic diamniotic twin pregnancy in second trimester; Short interval between pregnancies complicating pregnancy, antepartum; Twin pregnancy with single intrauterine death, second trimester, fetus 2; and Indication for care in labor and delivery, antepartum on their problem list.  ----------------------------------------------------------------------------------- Patient reports insomnia, 2 hours of sleep a night..   Contractions: Irregular. Vag. Bleeding: None.  Movement: Present. Denies leaking of fluid.  ----------------------------------------------------------------------------------- The following portions of the patient's history were reviewed and updated as appropriate: allergies, current medications, past family history, past medical history, past social history, past surgical history and problem list. Problem list updated.   Objective  Blood pressure 118/70, weight 204 lb (92.5 kg), last menstrual period 10/20/2017, unknown if currently breastfeeding. Pregravid weight 135 lb (61.2 kg) Total Weight Gain 69 lb (31.3 kg) Urinalysis:      Fetal Status: Fetal Heart Rate (bpm): 138 Fundal Height: 37 cm Movement: Present     General:  Alert, oriented and cooperative. Patient is in no acute distress.  Skin: Skin is warm and dry. No rash noted.   Cardiovascular: Normal heart rate noted  Respiratory: Normal respiratory effort, no problems with respiration noted  Abdomen: Soft, gravid, appropriate for gestational age. Pain/Pressure: Absent     Pelvic:  Cervical exam deferred        Extremities: Normal range of motion.  Edema: None  Mental Status: Normal mood and  affect. Normal behavior. Normal judgment and thought content.     Assessment   33 y.o. [id number] at [redacted]w[redacted]d by  07/24/2018, by Ultrasound presenting for routine prenatal visit  Plan   Pregnancy#4 Problems (from 10/20/17 to present)    Problem Noted Resolved   Twin pregnancy with single intrauterine death, second trimester, fetus 2 02/12/2018 by Conard Novak, MD No   Overview Addendum 05/27/2018  4:54 PM by Farrel Conners, CNM    Diagnosed 02/12/18 [ ]  immediate referral to [facility] (patient already has requested to have care transferred to [facility] for delivery). Patient had an appointment at [facility] and had initially planned to deliver there, but has changed her mind and will be delivering with [facility] at [facility].      Dichorionic diamniotic twin pregnancy in second trimester 12/03/2017 by Vena Austria, MD No   Overview Addendum 06/11/2018  8:40 AM by Tresea Mall, CNM    [X]  additional 1g folic acid  [X]  baseline preeclampsia labs [ ]  ASA after 1st trimester [ ]  Monthly growth scan [ ]  Delivery planning      Short interval between pregnancies complicating pregnancy, antepartum 12/03/2017 by Vena Austria, MD No   Supervision of high risk pregnancy, antepartum 08/13/2016 by Farrel Conners, CNM No   Overview Addendum 06/26/2018  9:24 AM by Nadara Mustard, MD     Clinic [facility] Prenatal Labs  Dating LMP = 6 week [place] Di/Di twin pregnancy Blood type: A/Positive/-- (12/27 0000)   Genetic Screen NIPS: Normal XX SMA neg, CF, neg, and Fragile-X neg Antibody:Negative (12/27 0000)  Anatomic [place] Incomplete spine views at [facility], completed at 20 weeks here at [facility] Rubella: Immune (12/27 0000)  GTT 111 RPR: Nonreactive (12/27 0000)   Flu vaccine 01/28/18 HBsAg: Negative (12/27 0000)   TDaP vaccine  done                              Rhogam: N/A HIV: Non-reactive (12/27 0000)   Baby Food Breast                                         GBS: (For PCN allergy, check sensitivities)    Contraception BTL planned Pap: 05/09/16 ASCUS with +HRHPV  Circumcision  LGSIL pap HPV positive, 16-18 negative 11/27/2017 [ ]  Colposcopy postpartum or outside of first trimester  Pediatrician    Support Person            Abnormal Pap smear of cervix 08/13/2016 by Farrel Conners, CNM No   Overview Addendum 01/28/2018  2:49 PM by Vena Austria, MD    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  11/27/2017 LGSIL HPV positive 16/18 neg / neg [ ]  colpo outside of first trimester (preferred) or postpartum (acceptable)      Abnormal Pap smear of vagina 01/28/2018 by Vena Austria, MD 01/28/2018 by Vena Austria, MD   Overview Signed 01/28/2018  2:46 PM by Vena Austria, MD    [ ]  Colposcopy postpartum          Gestational age appropriate obstetric precautions including but not limited to vaginal bleeding, contractions, leaking of fluid and fetal movement were reviewed in detail with the patient.    Discussed sleep hygiene and strategies for helping improve sleep. Ambien rx sent for insomnia.  Return in about 1 week (around 07/10/2018) for ROB.  Natale Milch MD [facility], [facility] 07/03/2018, 8:49 AM

## 2018-07-10 ENCOUNTER — Ambulatory Visit (INDEPENDENT_AMBULATORY_CARE_PROVIDER_SITE_OTHER): Payer: Medicaid Other | Admitting: Obstetrics and Gynecology

## 2018-07-10 VITALS — BP 104/52 | Wt 211.0 lb

## 2018-07-10 DIAGNOSIS — O09899 Supervision of other high risk pregnancies, unspecified trimester: Secondary | ICD-10-CM

## 2018-07-10 DIAGNOSIS — O3122X2 Continuing pregnancy after intrauterine death of one fetus or more, second trimester, fetus 2: Secondary | ICD-10-CM

## 2018-07-10 DIAGNOSIS — O2603 Excessive weight gain in pregnancy, third trimester: Secondary | ICD-10-CM

## 2018-07-10 DIAGNOSIS — O0990 Supervision of high risk pregnancy, unspecified, unspecified trimester: Secondary | ICD-10-CM

## 2018-07-10 DIAGNOSIS — Z3A38 38 weeks gestation of pregnancy: Secondary | ICD-10-CM

## 2018-07-10 LAB — POCT URINALYSIS DIPSTICK OB
Glucose, UA: NEGATIVE
POC,PROTEIN,UA: NEGATIVE

## 2018-07-10 NOTE — Progress Notes (Signed)
Routine Prenatal Care Visit  Subjective  Margaret [name] Given is a 33 y.o. [phone] at [redacted]w[redacted]d being seen today for ongoing prenatal care.  She is currently monitored for the following issues for this high-risk pregnancy and has Lump or mass in breast; Supervision of high risk pregnancy, antepartum; Abnormal Pap smear of cervix; Dichorionic diamniotic twin pregnancy in second trimester; Short interval between pregnancies complicating pregnancy, antepartum; and Twin pregnancy with single intrauterine death, second trimester, fetus 2 on their problem list.  ----------------------------------------------------------------------------------- Patient reports no complaints.   Contractions: Irregular. Vag. Bleeding: None.  Movement: Present. Denies leaking of fluid.  ----------------------------------------------------------------------------------- The following portions of the patient's history were reviewed and updated as appropriate: allergies, current medications, past family history, past medical history, past social history, past surgical history and problem list. Problem list updated.   Objective  Blood pressure (!) 104/52, weight 211 lb (95.7 kg), last menstrual period 10/20/2017, unknown if currently breastfeeding. Pregravid weight 135 lb (61.2 kg) Total Weight Gain 76 lb (34.5 kg) Urinalysis:      Fetal Status: Fetal Heart Rate (bpm): 135 Fundal Height: 39 cm Movement: Present  Presentation: Vertex  General:  Alert, oriented and cooperative. Patient is in no acute distress.  Skin: Skin is warm and dry. No rash noted.   Cardiovascular: Normal heart rate noted  Respiratory: Normal respiratory effort, no problems with respiration noted  Abdomen: Soft, gravid, appropriate for gestational age. Pain/Pressure: Present     Pelvic:  Cervical exam performed Dilation: 3 Effacement (%): 50 Station: -3  Extremities: Normal range of motion.     ental Status: Normal mood and affect. Normal behavior.  Normal judgment and thought content.     Assessment   33 y.o. [id number] at [redacted]w[redacted]d by  07/24/2018, by Ultrasound presenting for routine prenatal visit  Plan   Pregnancy#4 Problems (from 10/20/17 to present)    Problem Noted Resolved   Twin pregnancy with single intrauterine death, second trimester, fetus 2 02/12/2018 by Conard Novak, MD No   Overview Addendum 05/27/2018  4:54 PM by Farrel Conners, CNM    Diagnosed 02/12/18 [ ]  immediate referral to [facility] (patient already has requested to have care transferred to [facility] for delivery). Patient had an appointment at [facility] and had initially planned to deliver there, but has changed her mind and will be delivering with [facility].      Dichorionic diamniotic twin pregnancy in second trimester 12/03/2017 by Vena Austria, MD No   Overview Addendum 06/11/2018  8:40 AM by Tresea Mall, CNM    [X]  additional 1g folic acid  [X]  baseline preeclampsia labs [ ]  ASA after 1st trimester [ ]  Monthly growth scan [ ]  Delivery planning      Short interval between pregnancies complicating pregnancy, antepartum 12/03/2017 by Vena Austria, MD No   Supervision of high risk pregnancy, antepartum 08/13/2016 by Farrel Conners, CNM No   Overview Addendum 07/03/2018  8:49 AM by Natale Milch, MD     Clinic [facility] Prenatal Labs  Dating LMP = 6 week [place] Di/Di twin pregnancy Blood type: A/Positive/-- (12/27 0000)   Genetic Screen NIPS: Normal XX SMA neg, CF, neg, and Fragile-X neg Antibody:Negative (12/27 0000)  Anatomic [place] Incomplete spine views at [facility], completed at 20 weeks here at [facility] Rubella: Immune (12/27 0000)  GTT 111 RPR: Nonreactive (12/27 0000)   Flu vaccine 01/28/18 HBsAg: Negative (12/27 0000)   TDaP vaccine   done  Rhogam: N/A HIV: Non-reactive (12/27 0000)   Baby Food Breast                                         GBS:  Negative  Contraception BTL planned Pap: 05/09/16 ASCUS with +HRHPV    Circumcision N/A LGSIL pap HPV positive, 16-18 negative 11/27/2017 [ ]  Colposcopy postpartum or outside of first trimester  Pediatrician    Support Person Irisha Munier           Abnormal Pap smear of cervix 08/13/2016 by Farrel Conners, CNM No   Overview Addendum 01/28/2018  2:49 PM by Vena Austria, MD    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  11/27/2017 LGSIL HPV positive 16/18 neg / neg [ ]  colpo outside of first trimester (preferred) or postpartum (acceptable)      Abnormal Pap smear of vagina 01/28/2018 by Vena Austria, MD 01/28/2018 by Vena Austria, MD   Overview Signed 01/28/2018  2:46 PM by Vena Austria, MD    [ ]  Colposcopy postpartum          Gestational age appropriate obstetric precautions including but not limited to vaginal bleeding, contractions, leaking of fluid and fetal movement were reviewed in detail with the patient.   - TWG 76lbs, 1-hr 111, pelvis tested to 9lbs 4.5oz, growth scan next visit  Return in about 1 week (around 07/17/2018) for ROB and growth scan.  Vena Austria, MD, [name], [facility] 07/10/2018, 11:31 AM

## 2018-07-10 NOTE — Progress Notes (Signed)
ROB

## 2018-07-17 ENCOUNTER — Ambulatory Visit (INDEPENDENT_AMBULATORY_CARE_PROVIDER_SITE_OTHER): Payer: Medicaid Other

## 2018-07-17 ENCOUNTER — Ambulatory Visit (INDEPENDENT_AMBULATORY_CARE_PROVIDER_SITE_OTHER): Payer: Medicaid Other | Admitting: Obstetrics & Gynecology

## 2018-07-17 ENCOUNTER — Encounter: Payer: Self-pay | Admitting: Obstetrics & Gynecology

## 2018-07-17 VITALS — BP 120/80 | Wt 210.0 lb

## 2018-07-17 DIAGNOSIS — O0990 Supervision of high risk pregnancy, unspecified, unspecified trimester: Secondary | ICD-10-CM

## 2018-07-17 DIAGNOSIS — O09899 Supervision of other high risk pregnancies, unspecified trimester: Secondary | ICD-10-CM

## 2018-07-17 DIAGNOSIS — O2603 Excessive weight gain in pregnancy, third trimester: Secondary | ICD-10-CM

## 2018-07-17 DIAGNOSIS — Z362 Encounter for other antenatal screening follow-up: Secondary | ICD-10-CM

## 2018-07-17 DIAGNOSIS — Z3A39 39 weeks gestation of pregnancy: Secondary | ICD-10-CM

## 2018-07-17 DIAGNOSIS — O3122X2 Continuing pregnancy after intrauterine death of one fetus or more, second trimester, fetus 2: Secondary | ICD-10-CM

## 2018-07-17 LAB — POCT URINALYSIS DIPSTICK OB
Glucose, UA: NEGATIVE
POC,PROTEIN,UA: NEGATIVE

## 2018-07-17 NOTE — Progress Notes (Signed)
History and Physical  Margaret [name] Given is a 33 y.o. [id number] [redacted]w[redacted]d  for Induction of Labor scheduled due to Favorable cervix at term .   See labor record for pregnancy highlights.  No recent pain, bleeding, ruptured membranes, or other signs of progressing labor.  PMHx: She  has a past medical history of Vaginal Pap smear, abnormal. Also,  has a past surgical history that includes Tooth extraction., family history is not on file.,  reports that she quit smoking about 4 years ago. She smoked 0.00 packs per day for 0.00 years. She has never used smokeless tobacco. She reports that she does not drink alcohol or use drugs. She has a current medication list which includes the following prescription(s): multivitamin-prenatal and zolpidem. Also, has No Known Allergies. OB History  Gravida Para Term Preterm AB Living  4 3 3     3   SAB TAB Ectopic Multiple Live Births        0 3    # Outcome Date GA Lbr Len/2nd Weight Sex Delivery Anes PTL Lv  4 Current           3 Term 12/24/16 [redacted]w[redacted]d / 01:51 9 lb 4.5 oz (4.21 kg) F Vag-Spont EPI  LIV  2 Term 09/30/08 [redacted]w[redacted]d  8 lb 11 oz (3.941 kg) M Vag-Spont   LIV  1 Term 01/12/06 [redacted]w[redacted]d  7 lb 11 oz (3.487 kg) M Vag-Spont   LIV    Obstetric Comments  1st Menstrual Cycle:  15  1st Pregnancy:  19    First labor was induced for postdates  Second labor augmented after PROM  Patient denies any other pertinent gynecologic issues.   Review of Systems  Constitutional: Negative for chills, fever and malaise/fatigue.  HENT: Negative for congestion, sinus pain and sore throat.   Eyes: Negative for blurred vision and pain.  Respiratory: Negative for cough and wheezing.   Cardiovascular: Negative for chest pain and leg swelling.  Gastrointestinal: Negative for abdominal pain, constipation, diarrhea, heartburn, nausea and vomiting.  Genitourinary: Negative for dysuria, frequency, hematuria and urgency.  Musculoskeletal: Negative for back pain, joint pain, myalgias and  neck pain.  Skin: Negative for itching and rash.  Neurological: Negative for dizziness, tremors and weakness.  Endo/Heme/Allergies: Does not bruise/bleed easily.  Psychiatric/Behavioral: Negative for depression. The patient is not nervous/anxious and does not have insomnia.    Objective: BP 120/80   Wt 210 lb (95.3 kg)   LMP 10/20/2017 (Approximate)   BMI 32.89 kg/m  Physical Exam Vitals signs reviewed.   Physical examination Constitutional NAD, Conversant  Skin No rashes, lesions or ulceration. Normal palpated skin turgor. No nodularity.  Lungs: Clear to auscultation.No rales or wheezes. Normal Respiratory effort, no retractions.  Heart: NSR.  No murmurs or rubs appreciated. No periferal edema  Abdomen: Gravid.  Non-tender.  No masses.  No HSM. No hernia  Extremities: Moves all appropriately.  Normal ROM for age. No lymphadenopathy.  Neuro: Grossly intact  Psych: Oriented to PPT.  Normal mood. Normal affect.     Pelvic:   Vulva: Normal appearance.  No lesions.  Vagina: No lesions or abnormalities noted.  Urethra No masses tenderness or scarring.  Meatus Normal size without lesions or prolapse.  Cervix: 3/80/-3.   Perineum: Normal exam.  No lesions.        Bimanual   Uterus: Enlarged.  Non-tender.    Adnexae: Not palpated.  Cul-de-sac: Negative for abnormality.   Assessment: Term Pregnancy for Induction of Labor due to Favorable  cervix at term.  Plan: Patient will undergo induction of labor with cervical ripening agents.    Patient has been fully informed of the pros and cons, risks and benefits of continued observation with fetal monitoring versus that of induction of labor.   She understands that there are uncommon risks to induction, which include but are not limited to : frequent or prolonged uterine contractions, fetal distress, uterine rupture, and lack of successful induction.  These risks include all methods including Pitocin and Misoprostol and Cervadil.  Patient  understands that using Misoprostol for labor induction is an "off label" indication although it has been studied extensively for this purpose and is an accepted method of induction.  She also has been informed of the increased risks for Cesarean with induction and should induction not be successful.  Patient consents to the induction plan of management.  Plans to breast feed Plans bilateral tubal ligation for contraception TDaP UTD  Annamarie Major, MD, [name], [facility] 07/17/2018  1:16 PM

## 2018-07-17 NOTE — Progress Notes (Signed)
  [place] [facility] INDUCTION ASSESSMENT JUREA CECCARELLI Given [birth year] Medical record #: [id number] Phone #:  Home Phone [phone]  Mobile [phone]    [facility]atal Provider:[facility]ivering Group[facility] Proposed admission date/time:07/22/18 at 0800 Method of induction:Pitocin, vs AROM  Weight: Filed Weights03/05/20 0930Weight:210 lb (95.3 kg) BMI Body mass index is 32.89 kg/m. HIV Negative HSV Negative EDC Estimated Date of Delivery: 3/12/20based on:US at [redacted] wks  Gestational age on admission: 2 Gravidity/parity:[id number]  Cervix Score   0 1 2 3   Position  Midposition    Consistency   Soft   Effacement (%)    >80  Dilation (cm)   3-4   Baby's station -3      Bishop Score:8   Medical induction of labor  select indication(s) below Elective induction ?39 weeks multiparous patient ?39 weeks primiparous patient with Bishop score ?7 ?40 weeks primiparous patient  Provider Signature: Letitia Libra Scheduled OE:[id number] Manson Passey Date:07/17/2018 1:18 PM   Call [phone] to finalize the induction date/time  [id number] (07/17)

## 2018-07-17 NOTE — Progress Notes (Signed)
  Subjective  Fetal Movement? yes Contractions? no Leaking Fluid? no Vaginal Bleeding? no Mostly low back pains Objective  BP 120/80   Wt 210 lb (95.3 kg)   LMP 10/20/2017 (Approximate)   BMI 32.89 kg/m  General: NAD Pumonary: no increased work of breathing Abdomen: gravid, non-tender Extremities: no edema Psychiatric: mood appropriate, affect full SVE 3/80/-3 Assessment  33 y.o. [id number] at [redacted]w[redacted]d by  07/24/2018, by Ultrasound presenting for routine prenatal visit  Plan    [redacted] weeks gestation of pregnancy    -  Primary   Relevant Orders   POC Urinalysis Dipstick OB (Completed)    IOL discussed  Annamarie Major, MD, [name], [facility] 07/17/2018  1:13 PM

## 2018-07-22 ENCOUNTER — Inpatient Hospital Stay: Payer: Medicaid Other | Admitting: Certified Registered Nurse Anesthetist

## 2018-07-22 ENCOUNTER — Inpatient Hospital Stay
Admission: RE | Admit: 2018-07-22 | Discharge: 2018-07-24 | DRG: 807 | Disposition: A | Discharge status: Home / Self Care | Payer: Medicaid Other | Attending: Obstetrics & Gynecology | Admitting: Obstetrics & Gynecology

## 2018-07-22 ENCOUNTER — Other Ambulatory Visit: Payer: Self-pay

## 2018-07-22 DIAGNOSIS — O3122X2 Continuing pregnancy after intrauterine death of one fetus or more, second trimester, fetus 2: Secondary | ICD-10-CM | POA: Diagnosis present

## 2018-07-22 DIAGNOSIS — Z3A39 39 weeks gestation of pregnancy: Secondary | ICD-10-CM

## 2018-07-22 DIAGNOSIS — O09899 Supervision of other high risk pregnancies, unspecified trimester: Secondary | ICD-10-CM

## 2018-07-22 DIAGNOSIS — Z87891 Personal history of nicotine dependence: Secondary | ICD-10-CM

## 2018-07-22 DIAGNOSIS — O0990 Supervision of high risk pregnancy, unspecified, unspecified trimester: Secondary | ICD-10-CM

## 2018-07-22 DIAGNOSIS — O30042 Twin pregnancy, dichorionic/diamniotic, second trimester: Secondary | ICD-10-CM

## 2018-07-22 DIAGNOSIS — O26893 Other specified pregnancy related conditions, third trimester: Secondary | ICD-10-CM | POA: Diagnosis present

## 2018-07-22 DIAGNOSIS — R87610 Atypical squamous cells of undetermined significance on cytologic smear of cervix (ASC-US): Secondary | ICD-10-CM

## 2018-07-22 LAB — CBC
HCT: 35 % — ABNORMAL LOW (ref 36.0–46.0)
Hemoglobin: 11.9 g/dL — ABNORMAL LOW (ref 12.0–15.0)
MCH: 30.1 pg (ref 26.0–34.0)
MCHC: 34 g/dL (ref 30.0–36.0)
MCV: 88.4 fL (ref 80.0–100.0)
Platelets: 223 10*3/uL (ref 150–400)
RBC: 3.96 MIL/uL (ref 3.87–5.11)
RDW: 13 % (ref 11.5–15.5)
WBC: 10.1 10*3/uL (ref 4.0–10.5)
nRBC: 0 % (ref 0.0–0.2)

## 2018-07-22 LAB — TYPE AND SCREEN
ABO/RH(D): A POS
Antibody Screen: NEGATIVE

## 2018-07-22 MED ORDER — ACETAMINOPHEN 325 MG PO TABS: 650.0000 mg | ORAL_TABLET | ORAL | Status: DC | PRN

## 2018-07-22 MED ORDER — SODIUM CHLORIDE 0.9% FLUSH
3.0000 mL | Freq: Two times a day (BID) | INTRAVENOUS | Status: DC
  Administered 2018-07-23 (×2): 3 mL via INTRAVENOUS

## 2018-07-22 MED ORDER — SODIUM CHLORIDE 0.9% FLUSH: 3.0000 mL | INTRAVENOUS | Status: DC | PRN

## 2018-07-22 MED ORDER — MISOPROSTOL 200 MCG PO TABS
ORAL_TABLET | ORAL | Status: AC
  Filled 2018-07-22: qty 4

## 2018-07-22 MED ORDER — ZOLPIDEM TARTRATE 5 MG PO TABS: 5.0000 mg | ORAL_TABLET | Freq: Every evening | ORAL | Status: DC | PRN

## 2018-07-22 MED ORDER — BUTORPHANOL TARTRATE 2 MG/ML IJ SOLN: 1.0000 mg | INTRAMUSCULAR | Status: DC | PRN

## 2018-07-22 MED ORDER — FENTANYL 2.5 MCG/ML W/ROPIVACAINE 0.15% IN NS 100 ML EPIDURAL (ARMC)
EPIDURAL | Status: AC
  Filled 2018-07-22: qty 100

## 2018-07-22 MED ORDER — SODIUM CHLORIDE 0.9 % IV SOLN: 250.0000 mL | INTRAVENOUS | Status: DC | PRN

## 2018-07-22 MED ORDER — DIBUCAINE 1 % RE OINT: 1.0000 "application " | TOPICAL_OINTMENT | RECTAL | Status: DC | PRN

## 2018-07-22 MED ORDER — OXYCODONE-ACETAMINOPHEN 5-325 MG PO TABS: 1.0000 | ORAL_TABLET | ORAL | Status: DC | PRN

## 2018-07-22 MED ORDER — WITCH HAZEL-GLYCERIN EX PADS: 1.0000 "application " | MEDICATED_PAD | CUTANEOUS | Status: DC | PRN

## 2018-07-22 MED ORDER — OXYTOCIN BOLUS FROM INFUSION
500.0000 mL | Freq: Once | INTRAVENOUS | Status: AC
  Administered 2018-07-22: 500 mL via INTRAVENOUS

## 2018-07-22 MED ORDER — DIPHENHYDRAMINE HCL 25 MG PO CAPS
ORAL_CAPSULE | ORAL | Status: AC
  Filled 2018-07-22: qty 1

## 2018-07-22 MED ORDER — OXYTOCIN 40 UNITS IN NORMAL SALINE INFUSION - SIMPLE MED
2.5000 [IU]/h | INTRAVENOUS | Status: DC
  Administered 2018-07-22: 2.5 [IU]/h via INTRAVENOUS

## 2018-07-22 MED ORDER — LACTATED RINGERS IV SOLN
500.0000 mL | INTRAVENOUS | Status: DC | PRN
  Administered 2018-07-22: 500 mL via INTRAVENOUS

## 2018-07-22 MED ORDER — LIDOCAINE HCL (PF) 1 % IJ SOLN
INTRAMUSCULAR | Status: DC | PRN
  Administered 2018-07-22: 2 mL
  Administered 2018-07-22: 3 mL

## 2018-07-22 MED ORDER — COCONUT OIL OIL: 1.0000 "application " | TOPICAL_OIL | Status: DC | PRN

## 2018-07-22 MED ORDER — ONDANSETRON HCL 4 MG PO TABS: 4.0000 mg | ORAL_TABLET | ORAL | Status: DC | PRN

## 2018-07-22 MED ORDER — BUPIVACAINE HCL (PF) 0.25 % IJ SOLN
INTRAMUSCULAR | Status: DC | PRN
  Administered 2018-07-22: 4 mL via EPIDURAL
  Administered 2018-07-22: 8 mL via EPIDURAL
  Administered 2018-07-22: 3 mL via EPIDURAL

## 2018-07-22 MED ORDER — AMMONIA AROMATIC IN INHA
RESPIRATORY_TRACT | Status: AC
  Filled 2018-07-22: qty 10

## 2018-07-22 MED ORDER — LIDOCAINE HCL (PF) 1 % IJ SOLN
30.0000 mL | INTRAMUSCULAR | Status: DC | PRN
  Filled 2018-07-22: qty 30

## 2018-07-22 MED ORDER — SENNOSIDES-DOCUSATE SODIUM 8.6-50 MG PO TABS
2.0000 | ORAL_TABLET | ORAL | Status: DC
  Administered 2018-07-22: 2 via ORAL
  Filled 2018-07-22 (×3): qty 2

## 2018-07-22 MED ORDER — OXYTOCIN 10 UNIT/ML IJ SOLN
INTRAMUSCULAR | Status: AC
  Filled 2018-07-22: qty 2

## 2018-07-22 MED ORDER — DIPHENHYDRAMINE HCL 25 MG PO CAPS
25.0000 mg | ORAL_CAPSULE | Freq: Four times a day (QID) | ORAL | Status: DC | PRN
  Administered 2018-07-22: 25 mg via ORAL

## 2018-07-22 MED ORDER — SIMETHICONE 80 MG PO CHEW: 80.0000 mg | CHEWABLE_TABLET | ORAL | Status: DC | PRN

## 2018-07-22 MED ORDER — ONDANSETRON HCL 4 MG/2ML IJ SOLN: 4.0000 mg | INTRAMUSCULAR | Status: DC | PRN

## 2018-07-22 MED ORDER — TERBUTALINE SULFATE 1 MG/ML IJ SOLN: 0.2500 mg | Freq: Once | INTRAMUSCULAR | Status: DC | PRN

## 2018-07-22 MED ORDER — ONDANSETRON HCL 4 MG/2ML IJ SOLN: 4.0000 mg | Freq: Four times a day (QID) | INTRAMUSCULAR | Status: DC | PRN

## 2018-07-22 MED ORDER — LACTATED RINGERS IV SOLN
INTRAVENOUS | Status: DC
  Administered 2018-07-22 (×2): via INTRAVENOUS

## 2018-07-22 MED ORDER — IBUPROFEN 600 MG PO TABS
600.0000 mg | ORAL_TABLET | Freq: Four times a day (QID) | ORAL | Status: DC
  Administered 2018-07-22 – 2018-07-23 (×2): 600 mg via ORAL
  Filled 2018-07-22 (×2): qty 1

## 2018-07-22 MED ORDER — LIDOCAINE-EPINEPHRINE (PF) 1.5 %-1:200000 IJ SOLN
INTRAMUSCULAR | Status: DC | PRN
  Administered 2018-07-22: 3 mL via PERINEURAL

## 2018-07-22 MED ORDER — BENZOCAINE-MENTHOL 20-0.5 % EX AERO
1.0000 "application " | INHALATION_SPRAY | CUTANEOUS | Status: DC | PRN
  Administered 2018-07-22: 1 via TOPICAL
  Filled 2018-07-22: qty 56

## 2018-07-22 MED ORDER — FENTANYL 2.5 MCG/ML W/ROPIVACAINE 0.15% IN NS 100 ML EPIDURAL (ARMC)
EPIDURAL | Status: DC | PRN
  Administered 2018-07-22: 12 mL/h via EPIDURAL

## 2018-07-22 MED ORDER — OXYCODONE-ACETAMINOPHEN 5-325 MG PO TABS: 2.0000 | ORAL_TABLET | ORAL | Status: DC | PRN

## 2018-07-22 MED ORDER — OXYTOCIN 40 UNITS IN NORMAL SALINE INFUSION - SIMPLE MED
1.0000 m[IU]/min | INTRAVENOUS | Status: DC
  Administered 2018-07-22: 1 m[IU]/min via INTRAVENOUS
  Filled 2018-07-22: qty 1000

## 2018-07-22 MED ORDER — FAMOTIDINE IN NACL 20-0.9 MG/50ML-% IV SOLN
20.0000 mg | Freq: Once | INTRAVENOUS | Status: AC
  Administered 2018-07-22: 20 mg via INTRAVENOUS
  Filled 2018-07-22: qty 50

## 2018-07-22 NOTE — Anesthesia Preprocedure Evaluation (Addendum)
Anesthesia Evaluation  Patient identified by MRN, date of birth, ID band Patient awake    Airway Mallampati: II   Neck ROM: Full    Dental   Pulmonary former smoker,           Cardiovascular      Neuro/Psych    GI/Hepatic GERD  Medicated,  Endo/Other    Renal/GU      Musculoskeletal   Abdominal   Peds  Hematology   Anesthesia Other Findings   Reproductive/Obstetrics (+) Pregnancy                            Anesthesia Physical Anesthesia Plan  ASA: II  Anesthesia Plan: Epidural   Post-op Pain Management:    Induction:   PONV Risk Score and Plan:   Airway Management Planned:   Additional Equipment:   Intra-op Plan:   Post-operative Plan:   Informed Consent: I have reviewed the patients History and Physical, chart, labs and discussed the procedure including the risks, benefits and alternatives for the proposed anesthesia with the patient or authorized representative who has indicated his/her understanding and acceptance.       Plan Discussed with: CRNA and Anesthesiologist  Anesthesia Plan Comments:         Anesthesia Quick Evaluation

## 2018-07-22 NOTE — H&P (Signed)
History and Physical Interval Note:  07/22/2018 8:12 AM  Margaret [name]  has presented today for INDUCTION OF LABOR (pitocin, amniotomy),  with the diagnosis of Favorable cervix at term. The various methods of treatment have been discussed with the patient and family. After consideration of risks, benefits and other options for treatment, the patient has consented to  Labor induction .  The patient's history has been reviewed, patient examined, no change in status, and is stable for induction as planned.  See H&P. I have reviewed the patient's chart and labs.  Questions were answered to the patient's satisfaction.    Annamarie Major, MD, [name], [facility] 07/22/2018  8:12 AM

## 2018-07-22 NOTE — Progress Notes (Signed)
  Labor Progress Note   33 y.o. R4B6384 @ [redacted]w[redacted]d , admitted for  Pregnancy, Labor Management.   Subjective:  Pain w ctxs  Objective:  BP 110/75   Pulse 84   Temp 97.9 F (36.6 C) (Oral)   Resp 18   Ht 5\' 7"  (1.702 m)   Wt 97.5 kg   LMP 10/20/2017 (Approximate)   SpO2 100%   BMI 33.67 kg/m  Abd: gravid, ND, FHT present, moderate tenderness on exam Extr: trace to 1+ bilateral pedal edema SVE: CERVIX: 9 cm dilated, 90 effaced, +1 station  EFM: FHR: 130 bpm, variability: moderate,  accelerations:  Present,  decelerations:  Absent Toco: Frequency: Every 1-2 minutes Labs: I have reviewed the patient's lab results.  Assessment & Plan:  T3M4680 @ 107w5d, admitted for  Pregnancy and Labor/Delivery Management  1. Pain management: epidural. 2. FWB: FHT category 1.  3. ID: GBS negative 4. Labor management: Anticipate second stage soon  All discussed with patient, see orders  Annamarie Major, MD, [name], [facility] 07/22/2018  1:33 PM

## 2018-07-22 NOTE — Discharge Instructions (Signed)
Vaginal Delivery, Care After °Refer to this sheet in the next few weeks. These instructions provide you with information about caring for yourself after vaginal delivery. Your health care provider may also give you more specific instructions. Your treatment has been planned according to current medical practices, but problems sometimes occur. Call your health care provider if you have any problems or questions. °What can I expect after the procedure? °After vaginal delivery, it is common to have: °· Some bleeding from your vagina. °· Soreness in your abdomen, your vagina, and the area of skin between your vaginal opening and your anus (perineum). °· Pelvic cramps. °· Fatigue. °Follow these instructions at home: °Medicines °· Take over-the-counter and prescription medicines only as told by your health care provider. °· If you were prescribed an antibiotic medicine, take it as told by your health care provider. Do not stop taking the antibiotic until it is finished. °Driving ° °· Do not drive or operate heavy machinery while taking prescription pain medicine. °· Do not drive for 24 hours if you received a sedative. °Lifestyle °· Do not drink alcohol. This is especially important if you are breastfeeding or taking medicine to relieve pain. °· Do not use tobacco products, including cigarettes, chewing tobacco, or e-cigarettes. If you need help quitting, ask your health care provider. °Eating and drinking °· Drink at least 8 eight-ounce glasses of water every day unless you are told not to by your health care provider. If you choose to breastfeed your baby, you may need to drink more water than this. °· Eat high-fiber foods every day. These foods may help prevent or relieve constipation. High-fiber foods include: °? Whole grain cereals and breads. °? Brown rice. °? Beans. °? Fresh fruits and vegetables. °Activity °· Return to your normal activities as told by your health care provider. Ask your health care provider what  activities are safe for you. °· Rest as much as possible. Try to rest or take a nap when your baby is sleeping. °· Do not lift anything that is heavier than your baby or 10 lb (4.5 kg) until your health care provider says that it is safe. °· Talk with your health care provider about when you can engage in sexual activity. This may depend on your: °? Risk of infection. °? Rate of healing. °? Comfort and desire to engage in sexual activity. °Vaginal Care °· If you have an episiotomy or a vaginal tear, check the area every day for signs of infection. Check for: °? More redness, swelling, or pain. °? More fluid or blood. °? Warmth. °? Pus or a bad smell. °· Do not use tampons or douches until your health care provider says this is safe. °· Watch for any blood clots that may pass from your vagina. These may look like clumps of dark red, brown, or black discharge. °General instructions °· Keep your perineum clean and dry as told by your health care provider. °· Wear loose, comfortable clothing. °· Wipe from front to back when you use the toilet. °· Ask your health care provider if you can shower or take a bath. If you had an episiotomy or a perineal tear during labor and delivery, your health care provider may tell you not to take baths for a certain length of time. °· Wear a bra that supports your breasts and fits you well. °· If possible, have someone help you with household activities and help care for your baby for at least a few days after you   leave the hospital. °· Keep all follow-up visits for you and your baby as told by your health care provider. This is important. °Contact a health care provider if: °· You have: °? Vaginal discharge that has a bad smell. °? Difficulty urinating. °? Pain when urinating. °? A sudden increase or decrease in the frequency of your bowel movements. °? More redness, swelling, or pain around your episiotomy or vaginal tear. °? More fluid or blood coming from your episiotomy or vaginal  tear. °? Pus or a bad smell coming from your episiotomy or vaginal tear. °? A fever. °? A rash. °? Little or no interest in activities you used to enjoy. °? Questions about caring for yourself or your baby. °· Your episiotomy or vaginal tear feels warm to the touch. °· Your episiotomy or vaginal tear is separating or does not appear to be healing. °· Your breasts are painful, hard, or turn red. °· You feel unusually sad or worried. °· You feel nauseous or you vomit. °· You pass large blood clots from your vagina. If you pass a blood clot from your vagina, save it to show to your health care provider. Do not flush blood clots down the toilet without having your health care provider look at them. °· You urinate more than usual. °· You are dizzy or light-headed. °· You have not breastfed at all and you have not had a menstrual period for 12 weeks after delivery. °· You have stopped breastfeeding and you have not had a menstrual period for 12 weeks after you stopped breastfeeding. °Get help right away if: °· You have: °? Pain that does not go away or does not get better with medicine. °? Chest pain. °? Difficulty breathing. °? Blurred vision or spots in your vision. °? Thoughts about hurting yourself or your baby. °· You develop pain in your abdomen or in one of your legs. °· You develop a severe headache. °· You faint. °· You bleed from your vagina so much that you fill two sanitary pads in one hour. °This information is not intended to replace advice given to you by your health care provider. Make sure you discuss any questions you have with your health care provider. °Document Released: 04/27/2000 Document Revised: 10/12/2015 Document Reviewed: 05/15/2015 °Elsevier Interactive Patient Education © 2019 [employer] ° °

## 2018-07-22 NOTE — Progress Notes (Signed)
  Labor Progress Note   33 y.o. V4B4496 @ [redacted]w[redacted]d , admitted for  Pregnancy, Labor Management.   Subjective:  Min pain w contractions, Pitocin at 3 mU/min  Objective:  BP 124/74 (BP Location: Left Arm)   Pulse 79   Temp 98.2 F (36.8 C) (Oral)   Resp 18   Ht 5\' 7"  (1.702 m)   Wt 97.5 kg   LMP 10/20/2017 (Approximate)   SpO2 100%   BMI 33.67 kg/m  Abd: gravid, ND, FHT present, mild tenderness on exam Extr: trace to 1+ bilateral pedal edema SVE: CERVIX: 4 cm dilated, 60 effaced, -3 station AROM clear EFM: FHR: 140 bpm, variability: moderate,  accelerations:  Present,  decelerations:  Absent Toco: Frequency: Every 5 minutes Labs: I have reviewed the patient's lab results.   Assessment & Plan:  P5F1638 @ [redacted]w[redacted]d, admitted for  Pregnancy and Labor/Delivery Management  1. Pain management: none. 2. FWB: FHT category 1.  3. ID: GBS negative 4. Labor management: AROM clear, Pitocin, Epidural when reasy  All discussed with patient, see orders  Annamarie Major, MD, [name], [facility] 07/22/2018  10:03 AM

## 2018-07-22 NOTE — Discharge Summary (Signed)
OB Discharge Summary     Patient Name: Margaret [name] Given DOB: [birth year] MRN: [id number]  Date of admission: 07/22/2018 Delivering MD: Letitia Libra, MD  Date of Delivery: 07/22/2018  Date of discharge: 07/24/2018  Admitting diagnosis: Pregnancy, demise of twin at 14 weeks Intrauterine pregnancy: [redacted]w[redacted]d     Secondary diagnosis: None     Discharge diagnosis: Term Pregnancy Delivered, as above                         Hospital course:  Induction of Labor With Vaginal Delivery   33 y.o. yo [phone] at [redacted]w[redacted]d was admitted to the hospital 07/22/2018 for induction of labor.  Indication for induction: Favorable cervix at term.  Patient had an uncomplicated labor course as follows: Membrane Rupture Time/Date: 9:55 AM ,07/22/2018   Intrapartum Procedures: Episiotomy: None [1]                                         Lacerations:  None [1]  Patient had delivery of a Viable infant.  Information for the patient's newborn:  Given, Girl Kanaria [[id number]]  Delivery Method: Vag-Spont   07/22/2018  Details of delivery can be found in separate delivery note.  Patient had a routine postpartum course. Patient is discharged home 07/24/18.                                                                 Post partum procedures: None  Complications: None  Physical exam on 07/24/2018: Vitals:   07/23/18 0850 07/23/18 1530 07/23/18 2340 07/24/18 0810  BP: 111/67 115/71 118/67 119/81  Pulse: 74 84 74 85  Resp: 16 16 16 16   Temp: 98.6 F (37 C) 97.9 F (36.6 C) 98.1 F (36.7 C) 98.2 F (36.8 C)  TempSrc: Oral Oral Oral Tympanic  SpO2: 99% 99% 99% 100%  Weight:      Height:       General: alert, cooperative and no distress Lochia: appropriate Uterine Fundus: firm Incision: N/A DVT Evaluation: No evidence of DVT seen on physical exam.  Labs: Lab Results  Component Value Date   WBC 12.5 (H) 07/23/2018   HGB 10.8 (L) 07/23/2018   HCT 31.6 (L) 07/23/2018   MCV 90.3 07/23/2018   PLT 173  07/23/2018   CMP Latest Ref Rng & Units 12/03/2017  Glucose 65 - 99 mg/dL 70(Y)  BUN 6 - 20 mg/dL 14  Creatinine 1.74 - 9.44 mg/dL 9.67  Sodium 591 - 638 mmol/L 141  Potassium 3.5 - 5.2 mmol/L 4.7  Chloride 96 - 106 mmol/L 104  CO2 20 - 29 mmol/L 23  Calcium 8.7 - 10.2 mg/dL 9.6  Total Protein 6.0 - 8.5 g/dL 6.6  Total Bilirubin 0.0 - 1.2 mg/dL 0.3  Alkaline Phos 39 - 117 IU/L 38(L)  AST 0 - 40 IU/L 10  ALT 0 - 32 IU/L 13    Discharge instruction: per After Visit Summary.  Medications:  Allergies as of 07/24/2018   No Known Allergies     Medication List    STOP taking these medications   zolpidem 5 MG tablet Commonly known as:  AMBIEN  TAKE these medications   diphenhydrAMINE 25 MG tablet Commonly known as:  BENADRYL Take 25 mg by mouth every 6 (six) hours as needed.   multivitamin-prenatal 27-0.8 MG Tabs tablet Take 1 tablet by mouth daily at 12 noon.       Diet: routine diet  Activity: Advance as tolerated. Pelvic rest for 6 weeks.   Outpatient follow up: Follow-up Information    Nadara Mustard, MD. Schedule an appointment as soon as possible for a visit in 6 week(s).   Specialty:  Obstetrics and Gynecology Contact information: [address] [place] [address] [phone]             Postpartum contraception: Decided not to have tubal ligation on PPD#2, considering Nexplanon Rhogam Given postpartum: no Rubella vaccine given postpartum: no Varicella vaccine given postpartum: no TDaP given antepartum or postpartum: Yes  Newborn Data: Live born female  Birth Weight:   APGAR: 9, 9  Newborn Delivery   Birth date/time:  07/22/2018 17:05:00 Delivery type:  Vaginal, Spontaneous      Baby Feeding: Breast  Disposition: home with mother  SIGNED: Oswaldo Conroy, CNM 07/24/2018

## 2018-07-22 NOTE — Anesthesia Procedure Notes (Addendum)
Epidural Patient location during procedure: OB Start time: 07/22/2018 12:37 PM  Staffing Anesthesiologist: Alver Fisher, MD Resident/CRNA: Karoline Caldwell, CRNA Performed: resident/CRNA   Preanesthetic Checklist Completed: patient identified, site marked, surgical consent, pre-op evaluation, timeout performed, IV checked, risks and benefits discussed and monitors and equipment checked  Epidural Patient position: sitting Prep: ChloraPrep Patient monitoring: heart rate, continuous pulse ox and blood pressure Approach: midline Location: L3-L4 Injection technique: LOR saline  Needle:  Needle type: Tuohy  Needle gauge: 17 G Needle length: 9 cm and 9 Needle insertion depth: 5 cm Catheter type: closed end flexible Catheter size: 19 Gauge Catheter at skin depth: 11 cm Test dose: negative and 1.5% lidocaine with Epi 1:200 K  Assessment Sensory level: T10 Events: blood not aspirated, injection not painful, no injection resistance, negative IV test and no paresthesia  Additional Notes 1st attempt Pt. Evaluated and documentation done after procedure finished. Patient identified. Risks/Benefits/Options discussed with patient including but not limited to bleeding, infection, nerve damage, paralysis, failed block, incomplete pain control, headache, blood pressure changes, nausea, vomiting, reactions to medication both or allergic, itching and postpartum back pain. Confirmed with bedside nurse the patient's most recent platelet count. Confirmed with patient that they are not currently taking any anticoagulation, have any bleeding history or any family history of bleeding disorders. Patient expressed understanding and wished to proceed. All questions were answered. Sterile technique was used throughout the entire procedure. Please see nursing notes for vital signs. Test dose was given through epidural catheter and negative prior to continuing to dose epidural or start infusion. Warning signs of  high block given to the patient including shortness of breath, tingling/numbness in hands, complete motor block, or any concerning symptoms with instructions to call for help. Patient was given instructions on fall risk and not to get out of bed. All questions and concerns addressed with instructions to call with any issues or inadequate analgesia.   Patient tolerated the insertion well without immediate complications.Reason for block:procedure for pain

## 2018-07-23 LAB — CBC
HCT: 31.6 % — ABNORMAL LOW (ref 36.0–46.0)
HEMOGLOBIN: 10.8 g/dL — AB (ref 12.0–15.0)
MCH: 30.9 pg (ref 26.0–34.0)
MCHC: 34.2 g/dL (ref 30.0–36.0)
MCV: 90.3 fL (ref 80.0–100.0)
NRBC: 0 % (ref 0.0–0.2)
Platelets: 173 10*3/uL (ref 150–400)
RBC: 3.5 MIL/uL — ABNORMAL LOW (ref 3.87–5.11)
RDW: 13.2 % (ref 11.5–15.5)
WBC: 12.5 10*3/uL — ABNORMAL HIGH (ref 4.0–10.5)

## 2018-07-23 LAB — RPR: RPR Ser Ql: NONREACTIVE

## 2018-07-23 MED ORDER — IBUPROFEN 600 MG PO TABS
600.0000 mg | ORAL_TABLET | Freq: Four times a day (QID) | ORAL | Status: DC
  Administered 2018-07-23 – 2018-07-24 (×5): 600 mg via ORAL
  Filled 2018-07-23 (×5): qty 1

## 2018-07-23 MED ORDER — PRENATAL MULTIVITAMIN CH
1.0000 | ORAL_TABLET | Freq: Every day | ORAL | Status: DC
  Administered 2018-07-23 – 2018-07-24 (×2): 1 via ORAL
  Filled 2018-07-23 (×2): qty 1

## 2018-07-23 NOTE — Progress Notes (Signed)
RN in room to check about baby feeding; pt had been sleeping last 2 times RN checked on her; RN was looking at feeding paper and pt woke up; RN explained she was looking at the feeding paper; pt said "what time is it?"; RN, "it's 0615"; pt said "I last fed her at 145 this morning and we all have been sleeping"; RN encouraged pt to unswaddle blanket, check baby diaper and see if baby gives hunger cues and even try to breastfeed (as it's been 5.5 hours since last feeding); pt "ok"

## 2018-07-23 NOTE — Plan of Care (Signed)
Vs stable; up ad lib; tolerating regular diet; taking motrin for pain control; having BTL on Thursday 07-24-18; pt needs to have MD or midwife review procedure/risks/benefits with her and then she can sign consent; please keep SL in place and flush SL

## 2018-07-23 NOTE — Progress Notes (Signed)
Post Partum Day 1 Subjective: up ad lib, voiding, tolerating PO and mild cramping. Working with lactation regarding baby's latch. Desires postpartum BTL-scheduled for in the AM. Objective: Blood pressure 111/67, pulse 74, temperature 98.6 F (37 C), temperature source Oral, resp. rate 16, height 5\' 7"  (1.702 m), weight 97.5 kg, last menstrual period 10/20/2017, SpO2 99 %, unknown if currently breastfeeding.  Physical Exam:  General: alert, cooperative and no distress Lochia: appropriate Uterine Fundus: firm/ ML/ NT/ at U to U-1  DVT Evaluation: No evidence of DVT seen on physical exam. No significant calf/ankle edema.  Recent Labs    07/22/18 0846 07/23/18 0501  HGB 11.9* 10.8*  HCT 35.0* 31.6*  WBC 10.1 12.5*  PLT 223 173    Assessment/Plan: Stable PPD #1-continue postpartum care and lactation support Desires postpartum BTL 33 y.o. [id number]  with undesired fertility, desires permanent sterilization.  Other reversible forms of contraception were discussed with patient; she declines all other modalities. Permanent nature of as well as associated risks of the procedure discussed with patient including but not limited to: risk of regret, permanence of method, bleeding, infection, injury to surrounding organs and need for additional procedures.  Failure risk of 0.5-1% with increased risk of ectopic gestation if pregnancy occurs was also discussed with patient.  Consent obtained. NPO after midnight. 30 day papers signed 06/11/18 A POS/ RI/ VI Flu vaccine and TDAP UTD Breast feeding    LOS: 1 day   Margaret [name] 07/23/2018, 10:27 AM

## 2018-07-23 NOTE — Anesthesia Postprocedure Evaluation (Signed)
Anesthesia Post Note  Patient: Margaret [name] Given  Procedure(s) Performed: AN AD HOC LABOR EPIDURAL  Patient location during evaluation: Mother Baby Anesthesia Type: Epidural Level of consciousness: oriented and awake and alert Pain management: pain level controlled Vital Signs Assessment: post-procedure vital signs reviewed and stable Respiratory status: spontaneous breathing and respiratory function stable Cardiovascular status: blood pressure returned to baseline and stable Postop Assessment: no headache, no backache, no apparent nausea or vomiting and able to ambulate Anesthetic complications: no     Last Vitals:  Vitals:   07/22/18 2314 07/23/18 0300  BP: 124/66 119/68  Pulse: 71 77  Resp: 18 16  Temp: 36.6 C 36.9 C  SpO2: 100% 97%    Last Pain:  Vitals:   07/23/18 0430  TempSrc:   PainSc: Asleep                 Starling Manns

## 2018-07-23 NOTE — Lactation Note (Addendum)
This note was copied from a baby's chart. Lactation Consultation Note  Patient Name: Margaret [name] Today's Date: 07/23/2018 Reason for consult: Initial assessment   Maternal Data Formula Feeding for Exclusion: No Has patient been taught Hand Expression?: Yes Does the patient have breastfeeding experience prior to this delivery?: Yes(with 3rd child not 1st or 2nd)  Feeding Feeding Type: Breast Fed  LATCH Score Latch: Repeated attempts needed to sustain latch, nipple held in mouth throughout feeding, stimulation needed to elicit sucking reflex.  Audible Swallowing: Spontaneous and intermittent  Type of Nipple: Everted at rest and after stimulation  Comfort (Breast/Nipple): Engorged, cracked, bleeding, large blisters, severe discomfort  Hold (Positioning): Assistance needed to correctly position infant at breast and maintain latch.  LATCH Score: 6  Interventions Interventions: Assisted with latch;Breast feeding basics reviewed;Adjust position;Support pillows;Position options;Expressed milk  Lactation Tools Discussed/Used Tools: Nipple Shields(with curved tipped syringe) Nipple shield size: 20 Date initiated:: 07/23/18   Consult Status Consult Status: Follow-up Date: 07/23/18 Follow-up type: In-patient  Mother states that she is experiencing severe pain with latching and has visible trauma to her nipples. Oral assessment of infant's mouth revealed a very tight top lip and tight frenulum under her tongue. Infant would chew when a gloved finger was placed inside mouth to assess the suck and her tongue could not go past the gumline. Mother was [name] information on local specialists to decide if she wants to have infant's mouth evaluated for oral restrictions. Infant was able to latch using the nipple shield and breastfed for 10 minutes. Plan is for mother to attempt to breastfeed for no longer than 15 minutes per side with the nipple shield, pump afterwards and feed infant  expressed breastmilk.  Margaret [name] 07/23/2018, 11:02 AM

## 2018-07-24 ENCOUNTER — Encounter
Admission: RE | Disposition: A | Discharge status: Home / Self Care | Payer: Self-pay | Source: Home / Self Care | Attending: Obstetrics & Gynecology

## 2018-07-24 SURGERY — LIGATION, FALLOPIAN TUBE, POSTPARTUM
Anesthesia: General | Laterality: Bilateral

## 2018-07-24 MED ORDER — LACTATED RINGERS IV SOLN: INTRAVENOUS | Status: DC

## 2018-07-24 SURGICAL SUPPLY — 25 items
BLADE SURG SZ11 CARB STEEL (BLADE) ×3 IMPLANT
CHLORAPREP W/TINT 26 (MISCELLANEOUS) ×3 IMPLANT
DERMABOND ADVANCED (GAUZE/BANDAGES/DRESSINGS) ×2
DERMABOND ADVANCED .7 DNX12 (GAUZE/BANDAGES/DRESSINGS) ×1 IMPLANT
DRAPE LAPAROTOMY 100X77 ABD (DRAPES) ×3 IMPLANT
ELECT CAUTERY BLADE 6.4 (BLADE) ×3 IMPLANT
ELECT REM PT RETURN 9FT ADLT (ELECTROSURGICAL) ×3
ELECTRODE REM PT RTRN 9FT ADLT (ELECTROSURGICAL) ×1 IMPLANT
GLOVE BIOGEL PI IND STRL 6.5 (GLOVE) ×1 IMPLANT
GLOVE BIOGEL PI INDICATOR 6.5 (GLOVE) ×2
GLOVE SURG SYN 6.5 ES PF (GLOVE) ×3 IMPLANT
GOWN STRL REUS W/ TWL LRG LVL3 (GOWN DISPOSABLE) ×2 IMPLANT
GOWN STRL REUS W/TWL LRG LVL3 (GOWN DISPOSABLE) ×4
LABEL OR SOLS (LABEL) ×3 IMPLANT
NEEDLE HYPO 22GX1.5 SAFETY (NEEDLE) ×3 IMPLANT
NS IRRIG 500ML POUR BTL (IV SOLUTION) ×3 IMPLANT
PACK BASIN MINOR ARMC (MISCELLANEOUS) ×3 IMPLANT
RETRACTOR RING XSMALL (MISCELLANEOUS) ×1 IMPLANT
RTRCTR WOUND ALEXIS 13CM XS SH (MISCELLANEOUS) ×3
SUT CHROMIC 0 CT 1 (SUTURE) ×6 IMPLANT
SUT MNCRL 4-0 (SUTURE) ×2
SUT MNCRL 4-0 27XMFL (SUTURE) ×1
SUT VICRYL 0 AB UR-6 (SUTURE) ×3 IMPLANT
SUTURE MNCRL 4-0 27XMF (SUTURE) ×1 IMPLANT
SYR 10ML LL (SYRINGE) ×3 IMPLANT

## 2018-07-24 NOTE — Progress Notes (Signed)
Patient to OR for BTL 

## 2018-07-24 NOTE — Progress Notes (Signed)
Patient back from OR, refused BTL

## 2018-07-24 NOTE — Progress Notes (Signed)
Patient discharged home with infant. Discharge instructions, prescriptions and follow up appointment given to and reviewed with patient. Patient verbalized understanding. Patient wheeled out with infant by auxiliary.  

## 2018-07-24 NOTE — Plan of Care (Signed)
Vs stable; up ad lib; NPO since midnight; going to have BTL today; using breast pump and giving formula for baby

## 2018-07-25 ENCOUNTER — Other Ambulatory Visit: Payer: Self-pay

## 2018-07-25 MED ORDER — PRENATAL 27-0.8 MG PO TABS: 1.0000 | ORAL_TABLET | Freq: Every day | ORAL | 3 refills | Status: DC

## 2018-07-25 NOTE — Telephone Encounter (Signed)
Pt calling for refill of pnv; is breastfeeding; [facility] [address].  [phone]  Left detailed msg refill eRx'd.

## 2018-12-19 ENCOUNTER — Ambulatory Visit: Payer: Medicaid Other | Admitting: Certified Nurse Midwife

## 2019-01-09 ENCOUNTER — Encounter: Payer: Self-pay | Admitting: Maternal Newborn

## 2019-01-09 ENCOUNTER — Other Ambulatory Visit: Payer: Self-pay

## 2019-01-09 ENCOUNTER — Ambulatory Visit (INDEPENDENT_AMBULATORY_CARE_PROVIDER_SITE_OTHER): Payer: Medicaid Other | Admitting: Maternal Newborn

## 2019-01-09 VITALS — BP 100/60 | Ht 67.0 in | Wt 165.0 lb

## 2019-01-09 DIAGNOSIS — Z3009 Encounter for other general counseling and advice on contraception: Secondary | ICD-10-CM | POA: Diagnosis not present

## 2019-01-09 NOTE — Progress Notes (Signed)
Obstetrics & Gynecology Office Visit   Chief Complaint:  Chief Complaint  Patient presents with  . Contraception    History of Present Illness: Margaret [name] is here today to discuss birth control. She has used OCP in the past, but does not want to use this method now because it is difficult for her to remember to take a pill daily. She would like to know more about IUDs and Nexplanon. She is concerned about side effects such as weight gain and acne.  She has a past medical history significant for no contraindication to estrogen.  She specifically denies a history of migraine with aura and chronic hypertension.  Reported Patient's last menstrual period was 12/19/2018.  Review of Systems: Review of systems negative unless otherwise noted in HPI.  Past Medical History:  Past Medical History:  Diagnosis Date  . Vaginal Pap smear, abnormal     Past Surgical History:  Past Surgical History:  Procedure Laterality Date  . TOOTH EXTRACTION      Gynecologic History: Patient's last menstrual period was 12/19/2018.  Obstetric History: [id number]  Family History:  History reviewed. No pertinent family history.  Social History:  Social History   Socioeconomic History  . Marital status: Married    Spouse name: Not on file  . Number of children: Not on file  . Years of education: Not on file  . Highest education level: Not on file  Occupational History  . Not on file  Social Needs  . Financial resource strain: Not on file  . Food insecurity    Worry: Not on file    Inability: Not on file  . Transportation needs    Medical: Not on file    Non-medical: Not on file  Tobacco Use  . Smoking status: Former Smoker    Packs/day: 0.00    Years: 0.00    Pack years: 0.00    Quit date: 05/10/2014    Years since quitting: 4.6  . Smokeless tobacco: Never Used  Substance and Sexual Activity  . Alcohol use: No  . Drug use: No  . Sexual activity: Yes    Birth control/protection: None   Comment: unsure   Lifestyle  . Physical activity    Days per week: Not on file    Minutes per session: Not on file  . Stress: Not on file  Relationships  . Social Herbalist on phone: Not on file    Gets together: Not on file    Attends religious service: Not on file    Active member of club or organization: Not on file    Attends meetings of clubs or organizations: Not on file    Relationship status: Not on file  . Intimate partner violence    Fear of current or ex partner: Not on file    Emotionally abused: Not on file    Physically abused: Not on file    Forced sexual activity: Not on file  Other Topics Concern  . Not on file  Social History Narrative  . Not on file    Allergies:  No Known Allergies  Medications: Prior to Admission medications   Medication Sig Start Date End Date Taking? Authorizing Provider  diphenhydrAMINE (BENADRYL) 25 MG tablet Take 25 mg by mouth every 6 (six) hours as needed.    [provider]  Prenatal Vit-Fe Fumarate-FA (MULTIVITAMIN-PRENATAL) 27-0.8 MG TABS tablet Take 1 tablet by mouth daily at 12 noon. Patient not taking: Reported on 01/09/2019  07/25/18   Tresea MallGledhill, Jane, CNM    Physical Exam Vitals:  Vitals:   01/09/19 0809  BP: 100/60   Patient's last menstrual period was 12/19/2018.  General: NAD HEENT: normocephalic, anicteric Pulmonary: No increased work of breathing Neurologic: Grossly intact Psychiatric: mood appropriate, affect full  Assessment: 33 y.o. [id number] here for contraceptive counseling.  Plan: Problem List Items Addressed This Visit    None    Visit Diagnoses    Encounter for counseling regarding contraception    -  Primary     Reviewed birth control options available including hormonal contraceptive medication including pill, patch, ring, injection, contraceptive implant; hormonal and nonhormonal IUDs. Risks and benefits reviewed.  Questions were answered. Actual size models of Nexplanon and  IUD were shown. Written information was given to patient to review and brochures were given for Mirena/Kyleena and Nexplanon.   A total of 15 minutes were spent in face-to-face contact with the patient during this encounter with over half of that time devoted to counseling and coordination of care.  Marcelyn BruinsJacelyn , CNM 01/09/2019  9:07 AM

## 2019-01-13 ENCOUNTER — Telehealth: Payer: Self-pay | Admitting: Maternal Newborn

## 2019-01-13 NOTE — Telephone Encounter (Signed)
9/11 at 950 for nexplanon insert with JC

## 2019-01-20 NOTE — Telephone Encounter (Signed)
Noted. Nexplanon reserved for this patient. 

## 2019-01-23 ENCOUNTER — Ambulatory Visit (INDEPENDENT_AMBULATORY_CARE_PROVIDER_SITE_OTHER): Payer: Medicaid Other | Admitting: Maternal Newborn

## 2019-01-23 ENCOUNTER — Encounter: Payer: Self-pay | Admitting: Maternal Newborn

## 2019-01-23 ENCOUNTER — Other Ambulatory Visit: Payer: Self-pay

## 2019-01-23 VITALS — BP 100/70 | Ht 68.0 in | Wt 164.0 lb

## 2019-01-23 DIAGNOSIS — Z30017 Encounter for initial prescription of implantable subdermal contraceptive: Secondary | ICD-10-CM

## 2019-01-23 NOTE — Progress Notes (Signed)
  GYNECOLOGY PROCEDURE NOTE  Patient is a 33 y.o. [id number] presenting for Nexplanon insertion as her desired means of contraception.  She provided informed consent, signed copy in the chart, time out was performed.  Patient's last menstrual period was 01/15/2019 (approximate).  She understands that Nexplanon is a progesterone only therapy, and that patients often patients have irregular and unpredictable vaginal bleeding or amenorrhea. She understands that other side effects are possible related to systemic progesterone, including but not limited to, headaches, breast tenderness, nausea, and irritability. The placement procedure for Nexplanon was reviewed with the patient in detail. She understands that the Nexplanon implant is good for 3 years and needs to be removed at the end of that time.  She understands that Nexplanon is an extremely effective option for contraception, with failure rate of <1%. Informed consent was obtained, both verbally and written. The patient is healthy and has no contraindications to Nexplanon use.   Procedure Appropriate time out taken.  Patient placed in dorsal supine with left arm above head, elbow flexed at 90 degrees, arm resting on examination table.  The bicipital grove was palpated and site 8-10cm proximal to the medial epicondyle was indentified . The insertion site was prepped with a two betadine swabs and then injected with 1 cc of lidocaine without epinephrine.  Nexplanon removed from sterile blister packaging,  Device confirmed in needle, before inserting full length of needle, tenting up the skin as the needle was advanced.  The drug eluting rod was then deployed by pulling back the slider per the manufacturer's recommendation.  The implant was palpable by the clinician as well as the patient.  Steristrips were applied to the insertion site, which was dressed with a band aid before applying a Kerlex bandage pressure dressing. Minimal blood loss was noted during the  procedure.  The patient tolerated the procedure well.   She was instructed to wear the bandage for 24 hours, call with any signs of infection.  She was given the Nexplanon card and instructed to have the rod removed in 3 years.  Avel Sensor, CNM 01/23/2019  10:42 AM

## 2019-01-23 NOTE — Patient Instructions (Signed)
Nexplanon Instructions After Insertion  Keep bandage clean and dry for 24 hours  May use ice/Tylenol/Ibuprofen for soreness or pain  If you develop fever, drainage or increased warmth from incision site-contact office immediately   

## 2019-08-03 NOTE — Telephone Encounter (Signed)
Nexplanon rcvd/charged 01/23/2019

## 2019-10-23 ENCOUNTER — Ambulatory Visit (INDEPENDENT_AMBULATORY_CARE_PROVIDER_SITE_OTHER): Payer: Medicaid Other | Admitting: Obstetrics & Gynecology

## 2019-10-23 ENCOUNTER — Other Ambulatory Visit: Payer: Self-pay

## 2019-10-23 ENCOUNTER — Encounter: Payer: Self-pay | Admitting: Obstetrics & Gynecology

## 2019-10-23 ENCOUNTER — Other Ambulatory Visit (HOSPITAL_COMMUNITY)
Admission: RE | Admit: 2019-10-23 | Discharge: 2019-10-23 | Disposition: A | Discharge status: Home / Self Care | Payer: Medicaid Other | Source: Ambulatory Visit | Attending: Obstetrics & Gynecology | Admitting: Obstetrics & Gynecology

## 2019-10-23 VITALS — BP 100/70 | Ht 68.0 in | Wt 158.0 lb

## 2019-10-23 DIAGNOSIS — Z01419 Encounter for gynecological examination (general) (routine) without abnormal findings: Secondary | ICD-10-CM

## 2019-10-23 DIAGNOSIS — F520 Hypoactive sexual desire disorder: Secondary | ICD-10-CM

## 2019-10-23 DIAGNOSIS — R87612 Low grade squamous intraepithelial lesion on cytologic smear of cervix (LGSIL): Secondary | ICD-10-CM | POA: Insufficient documentation

## 2019-10-23 DIAGNOSIS — Z0000 Encounter for general adult medical examination without abnormal findings: Secondary | ICD-10-CM | POA: Diagnosis not present

## 2019-10-23 MED ORDER — ADDYI 100 MG PO TABS: 1.0000 | ORAL_TABLET | Freq: Every day | ORAL | 11 refills | Status: DC

## 2019-10-23 NOTE — Progress Notes (Signed)
HPI:      Margaret [name] is a 34 y.o. [phone] who LMP was No LMP recorded., she presents today for her annual examination. The patient has no complaints today. The patient is sexually active.  She reports life long concerns with low libido and inability to orgasm, more so now and it interferes w her relationship.  She reports otherwise good relationship and low stress.  She has four kids, two are under the age of 28.    Her last pap: approximate date 2019 and was abnormal: LGSIL, long h/o LGSIL (many years). The patient does perform self breast exams.  There is no notable family history of breast or ovarian cancer in her family.  The patient has regular exercise: yes.  The patient denies current symptoms of depression.    GYN History: Contraception: Nexplanon  Has irreg bleeding and prolonged spotting w this BC  PMHx: Past Medical History:  Diagnosis Date  . Vaginal Pap smear, abnormal    Past Surgical History:  Procedure Laterality Date  . TOOTH EXTRACTION     History reviewed. No pertinent family history. Social History   Tobacco Use  . Smoking status: Former Smoker    Packs/day: 0.00    Years: 0.00    Pack years: 0.00    Quit date: 05/10/2014    Years since quitting: 5.4  . Smokeless tobacco: Never Used  Vaping Use  . Vaping Use: Never used  Substance Use Topics  . Alcohol use: No  . Drug use: No    Current Outpatient Medications:  .  etonogestrel (NEXPLANON) 68 MG IMPL implant, 1 each by Subdermal route once., Disp: , Rfl:  .  Flibanserin (ADDYI) 100 MG TABS, Take 1 tablet by mouth daily., Disp: 30 tablet, Rfl: 11 Allergies: Patient has no known allergies.  Review of Systems  Constitutional: Negative for chills, fever and malaise/fatigue.  HENT: Negative for congestion, sinus pain and sore throat.   Eyes: Negative for blurred vision and pain.  Respiratory: Negative for cough and wheezing.   Cardiovascular: Negative for chest pain and leg swelling.    Gastrointestinal: Negative for abdominal pain, constipation, diarrhea, heartburn, nausea and vomiting.  Genitourinary: Negative for dysuria, frequency, hematuria and urgency.  Musculoskeletal: Negative for back pain, joint pain, myalgias and neck pain.  Skin: Negative for itching and rash.  Neurological: Negative for dizziness, tremors and weakness.  Endo/Heme/Allergies: Does not bruise/bleed easily.  Psychiatric/Behavioral: Negative for depression. The patient is not nervous/anxious and does not have insomnia.     Objective: BP 100/70   Ht 5\' 8"  (1.727 m)   Wt 158 lb (71.7 kg)   BMI 24.02 kg/m   Filed Weights   10/23/19 0849  Weight: 158 lb (71.7 kg)   Body mass index is 24.02 kg/m. Physical Exam Constitutional:      General: She is not in acute distress.    Appearance: She is well-developed.  Genitourinary:     Pelvic exam was performed with patient supine.     Vagina, uterus and rectum normal.     No lesions in the vagina.     No vaginal bleeding.     No cervical motion tenderness, friability, lesion or polyp.     Uterus is mobile.     Uterus is not enlarged.     No uterine mass detected.    Uterus is midaxial.     No right or left adnexal mass present.     Right adnexa not tender.  Left adnexa not tender.  HENT:     Head: Normocephalic and atraumatic. No laceration.     Right Ear: Hearing normal.     Left Ear: Hearing normal.     Mouth/Throat:     Pharynx: Uvula midline.  Eyes:     Pupils: Pupils are equal, round, and reactive to light.  Neck:     Thyroid: No thyromegaly.  Cardiovascular:     Rate and Rhythm: Normal rate and regular rhythm.     Heart sounds: No murmur heard.  No friction rub. No gallop.   Pulmonary:     Effort: Pulmonary effort is normal. No respiratory distress.     Breath sounds: Normal breath sounds. No wheezing.  Chest:     Breasts:        Right: No mass, skin change or tenderness.        Left: No mass, skin change or tenderness.   Abdominal:     General: Bowel sounds are normal. There is no distension.     Palpations: Abdomen is soft.     Tenderness: There is no abdominal tenderness. There is no rebound.  Musculoskeletal:        General: Normal range of motion.     Cervical back: Normal range of motion and neck supple.  Neurological:     Mental Status: She is alert and oriented to person, place, and time.     Cranial Nerves: No cranial nerve deficit.  Skin:    General: Skin is warm and dry.  Psychiatric:        Judgment: Judgment normal.  Vitals reviewed.     Assessment:  ANNUAL EXAM 1. Women's annual routine gynecological examination   2. LGSIL on Pap smear of cervix   3. Hypoactive sexual desire disorder      Screening Plan:            1.  Cervical Screening-  Pap smear done today  2. Breast screening- Exam annually and mammogram>40 planned   3. Colonoscopy every 10 years, Hemoccult testing - after age 34  4. Labs managed by PCP  5. Counseling for contraception: Nexplanon, cont for now despite prolonged bleeding side effects.  Considring Lap BTL.  Pros and cons and permanency discussed.   6. Hypoactive sexual desire disorder - Discussed psychological, social, and physical roots for this disorder.  Counseling an option.  Medicine has its pros and cons, and effectiveness is not the same for all.  Wishes to try. - Flibanserin (ADDYI) 100 MG TABS; Take 1 tablet by mouth daily.  Dispense: 30 tablet; Refill: 11      F/U  Return in about 1 year (around 10/22/2020) for Annual.  Annamarie Major, MD, [name] 10/23/2019  9:27 AM

## 2019-10-23 NOTE — Patient Instructions (Addendum)
Flibanserin oral tablets What is this medicine? FLIBANSERIN (fly BAN ser in) is used to treat hypoactive (low) sexual desire disorder (HSDD) in women who have not gone through menopause, who have not had low sexual desire in the past, and who have low sexual desire no matter the type of sexual activity, the situation, or the sexual partner. Women with HSDD have a low sexual desire that is troubling to them, and is not due to a medical or mental health problem, problems in the relationship, medicines, or drug abuse. This medicine is not for HSDD in women who have gone through menopause. This medicine is not for men. This medicine not for use to improve sexual performance. This medicine may be used for other purposes; ask your health care provider or pharmacist if you have questions. COMMON BRAND NAME(S): Addyi What should I tell my health care provider before I take this medicine? They need to know if you have any of these conditions:  dehydration  if you drink alcohol  drug abuse or addiction  heart disease  history of depression or other mental health problems  history of a drug or alcohol abuse problem  liver disease  low blood pressure  an unusual or allergic reaction to flibanserin, other medicines, foods, dyes, or preservatives  pregnant or trying to get pregnant  breast-feeding How should I use this medicine? Take this medicine by mouth with a glass of water. Do not take with grapefruit juice. Follow the directions on the prescription label. This medicine should only be taken at bedtime. Taking it at a time other than bedtime can increase your risk for side effects such as low blood pressure, fainting, accidentaly injury, and daytime drowsiness. If you drink alcohol wait at least 2 hours after you stop drinking alcohol before taking your dose at bedtime. Another choice is to skip your dose at bedtime if you drink alcohol in the evening. After taking your bedtime dose, do not  drink alcohol until the next day. Take your medicine at regular intervals. Do not take it more often than directed. Do not stop taking except on your doctor's advice. A special MedGuide will be given to you by the pharmacist with each prescription and refill. Be sure to read this information carefully each time. Talk to your pediatrician regarding the use of this medicine in children. This medicine is not for use in children. Overdosage: If you think you have taken too much of this medicine contact a poison control center or emergency room at once. NOTE: This medicine is only for you. Do not share this medicine with others. What if I miss a dose? If you miss your dose at bedtime, skip the missed dose and take the next dose at bedtime the next day. Do not take this medicine the next morning or double your next dose. What may interact with this medicine? Do not take this medicine with any of the following medications:  certain antivirals for HIV or hepatitis  certain medicines for fungal infections like fluconazole, ketoconazole, itraconazole, or posaconazole  ciprofloxacin  clarithromycin  conivaptan  diltiazem  erythromycin  grapefruit juice  nefazodone  telithromycin  verapamil This medicine may also interact with the following medications:  alcohol  birth control pills  bupropion  certain medicines for anxiety or sleep  certain medicines for seizures like carbamazepine, phenobarbital, phenytoin  certain medicines for stomach problems like cimetidine, esomeprazole, dexlansoprazole, lansoprazole, omeprazole, pantoprazole, rabeprazole, ranitidine  digoxin  diphenhydramine  etravirine  fluoxetine  fluvoxamine    ginkgo biloba  lorcaserin  narcotic medicines for pain  resveratrol  rifabutin  rifampin  rifapentine  sirolimus  St. John's Wort This list may not describe all possible interactions. Give your health care provider a list of all the  medicines, herbs, non-prescription drugs, or dietary supplements you use. Also tell them if you smoke, drink alcohol, or use illegal drugs. Some items may interact with your medicine. What should I watch for while using this medicine? Visit your doctor or health care professional for regular checks on your progress. Tell your doctor if your symptoms have not improved after you have taken this medicine for 8 weeks. You may get dizzy or drowsy. Do not drive, use machinery, or do anything that needs mental alertness for at least 6 hours after you take your dose and until you know how this medicine affects you. The risk of severe drowsiness is increased if you are also taking other medicines that cause drowsiness, or if you take this medicine during waking hours. Only take this medicine at bedtime. Alcohol can increase dizziness and drowsiness, and can increase the risk of low blood pressure or fainting spells when combined with this medicine. If you drink alcohol wait at least 2 hours after you stop drinking alcohol before taking your medicine at bedtime. Alternatively, skip your bedtime dose if you drink alcohol in the evening. After you have taken your medicine at bedtime do not drink alcohol until the following day. Do not stand or sit up quickly. This reduces the risk of dizzy or fainting spells. This medicine can cause low blood pressure, sometimes with dizziness and fainting spells. If you begin to feel dizzy or lightheaded, lie down and call for help if the symptoms don't go away. This medicine is only available through a restricted program called the [employer] Program, and can only be obtained through certified pharmacies participating in the program. For more information about the Program and a list of pharmacies that are enrolled in the Program, go to www.AddyiREMS.com or call 1-844-PINK-PILL (1-844-746-5745). What side effects may I notice from receiving this medicine? Side effects that you should  report to your doctor or health care professional as soon as possible:  allergic reactions like skin rash, itching or hives, swelling of the face, lips, or tongue  extreme drowsiness  signs and symptoms of low blood pressure like dizziness; feeling faint or lightheaded, falls; unusually weak or tired Side effects that usually do not require medical attention (report to your doctor or health care professional if they continue or are bothersome):  dry mouth  nausea  tiredness  trouble sleeping This list may not describe all possible side effects. Call your doctor for medical advice about side effects. You may report side effects to FDA at 1-800-FDA-1088. Where should I keep my medicine? Keep out of the reach of children. Store at room temperature between 15 and 30 degrees C (59 and 86 degrees F). Throw away any unused medicine after the expiration date. NOTE: This sheet is a summary. It may not cover all possible information. If you have questions about this medicine, talk to your doctor, pharmacist, or health care provider.  2020 Elsevier/Gold Standard (2018-02-11 14:13:32)  

## 2019-10-27 LAB — CYTOLOGY - PAP

## 2019-10-28 ENCOUNTER — Telehealth: Payer: Self-pay | Admitting: Obstetrics & Gynecology

## 2019-10-28 NOTE — Progress Notes (Signed)
Sch colpo for abn low grade PAP Left message on phone and on MyChart Thx

## 2019-10-28 NOTE — Telephone Encounter (Signed)
-----   Message from Nadara Mustard, MD sent at 10/28/2019 10:12 AM EDT ----- Sch colpo for abn low grade PAP Left message on phone and on MyChart Thx

## 2019-10-28 NOTE — Telephone Encounter (Signed)
Patient is scheduled for 11/18/19 with [facility]

## 2019-11-12 DIAGNOSIS — D069 Carcinoma in situ of cervix, unspecified: Secondary | ICD-10-CM

## 2019-11-12 HISTORY — DX: Carcinoma in situ of cervix, unspecified: D06.9

## 2019-11-18 ENCOUNTER — Other Ambulatory Visit (HOSPITAL_COMMUNITY)
Admission: RE | Admit: 2019-11-18 | Discharge: 2019-11-18 | Disposition: A | Discharge status: Home / Self Care | Payer: Medicaid Other | Source: Ambulatory Visit | Attending: Obstetrics & Gynecology | Admitting: Obstetrics & Gynecology

## 2019-11-18 ENCOUNTER — Ambulatory Visit (INDEPENDENT_AMBULATORY_CARE_PROVIDER_SITE_OTHER): Payer: Medicaid Other | Admitting: Obstetrics & Gynecology

## 2019-11-18 ENCOUNTER — Other Ambulatory Visit: Payer: Self-pay

## 2019-11-18 ENCOUNTER — Encounter: Payer: Self-pay | Admitting: Obstetrics & Gynecology

## 2019-11-18 VITALS — BP 120/80 | Ht 67.0 in | Wt 158.0 lb

## 2019-11-18 DIAGNOSIS — R87612 Low grade squamous intraepithelial lesion on cytologic smear of cervix (LGSIL): Secondary | ICD-10-CM | POA: Insufficient documentation

## 2019-11-18 NOTE — Patient Instructions (Signed)

## 2019-11-18 NOTE — Progress Notes (Signed)
HPI:  Margaret [name] is a 34 y.o.  [phone]  who presents today for evaluation and management of abnormal cervical cytology.    Dysplasia History:  Chronic LGSIL  ROS:  Pertinent items are noted in HPI.  OB History  Gravida Para Term Preterm AB Living  4 4 4     4   SAB TAB Ectopic Multiple Live Births        0 4    # Outcome Date GA Lbr Len/2nd Weight Sex Delivery Anes PTL Lv  4 Term 07/22/18 [redacted]w[redacted]d / 02:22 7 lb 11.8 oz (3.51 kg) F Vag-Spont EPI  LIV  3 Term 12/24/16 [redacted]w[redacted]d / 01:51 9 lb 4.5 oz (4.21 kg) F Vag-Spont EPI  LIV  2 Term 09/30/08 [redacted]w[redacted]d  8 lb 11 oz (3.941 kg) M Vag-Spont   LIV  1 Term 01/12/06 [redacted]w[redacted]d  7 lb 11 oz (3.487 kg) M Vag-Spont   LIV    Obstetric Comments  1st Menstrual Cycle:  15  1st Pregnancy:  19    First labor was induced for postdates  Second labor augmented after PROM    Past Medical History:  Diagnosis Date  . Vaginal Pap smear, abnormal     Past Surgical History:  Procedure Laterality Date  . TOOTH EXTRACTION      SOCIAL HISTORY: Social History   Substance and Sexual Activity  Alcohol Use No   Social History   Substance and Sexual Activity  Drug Use No     History reviewed. No pertinent family history.  ALLERGIES:  Patient has no known allergies.  Current Outpatient Medications on File Prior to Visit  Medication Sig Dispense Refill  . etonogestrel (NEXPLANON) 68 MG IMPL implant 1 each by Subdermal route once.    . Flibanserin (ADDYI) 100 MG TABS Take 1 tablet by mouth daily. (Patient not taking: Reported on 11/18/2019) 30 tablet 11   No current facility-administered medications on file prior to visit.    Physical Exam: -Vitals:  BP 120/80   Ht 5\' 7"  (1.702 m)   Wt 158 lb (71.7 kg)   LMP 11/11/2019   BMI 24.75 kg/m  GEN: WD, WN, NAD.  A+ O x 3, good mood and affect. ABD:  NT, ND.  Soft, no masses.  No hernias noted.   Pelvic:   Vulva: Normal appearance.  No lesions.  Vagina: No lesions or abnormalities noted.  Support:  Normal pelvic support.  Urethra No masses tenderness or scarring.  Meatus Normal size without lesions or prolapse.  Cervix: See below.  Anus: Normal exam.  No lesions.  Perineum: Normal exam.  No lesions.        Bimanual   Uterus: Normal size.  Non-tender.  Mobile.  AV.  Adnexae: No masses.  Non-tender to palpation.  Cul-de-sac: Negative for abnormality.   PROCEDURE: 1.  Urine Pregnancy Test:  not done 2.  Colposcopy performed with 4% acetic acid after verbal consent obtained                                         -Aceto-white Lesions Location(s): internal to os. Wide os              -Biopsy performed at 6, 12 o'clock               -ECC indicated and performed: Yes.       -Biopsy sites  made hemostatic with pressure, AgNO3, and/or Monsel's solution   -Satisfactory colposcopy: Yes.      -Evidence of Invasive cervical CA :  NO  ASSESSMENT:  Margaret [name] is a 34 y.o. [id number] here for  1. LGSIL on Pap smear of cervix   .  PLAN: 1.  I discussed the grading system of pap smears and HPV high risk viral types.  We will discuss and base management after colpo results return. 2. Follow up PAP 6 months, vs intervention if high grade dysplasia identified 3. Treatment of persistantly abnormal PAP smears and cervical dysplasia, even mild, is discussed w pt today in detail, as well as the pros and cons of Cryo and LEEP procedures. Will consider and discuss after results.  CRYO as next option if CIN I or II, even if normal based on chronic long term LGSIL LEEP if CIN III     Annamarie Major, MD, [name], [facility] 11/18/2019  2:38 PM

## 2019-11-20 ENCOUNTER — Telehealth: Payer: Self-pay | Admitting: Obstetrics & Gynecology

## 2019-11-20 LAB — SURGICAL PATHOLOGY

## 2019-11-20 NOTE — Progress Notes (Signed)
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  Pt agrees.  Surgery Booking Request Patient Full Name:  Margaret [name] Given  MRN: [id number]  DOB: Jul 28, 1985  Surgeon: Letitia Libra, MD  Requested Surgery Date and Time: Aug 5 Primary Diagnosis AND Code: CIN III Secondary Diagnosis and Code:  Surgical Procedure: LEEP L&D Notification: No Admission Status: same day surgery Length of Surgery: 20 min Special Case Needs: No H&P: Yes Phone Interview???:  Yes Interpreter: No Language:  Medical Clearance:  No Special Scheduling Instructions: none Any known health/anesthesia issues, diabetes, sleep apnea, latex allergy, defibrillator/pacemaker?: No Acuity: P2 (cancer risk)   (P1 highest, P2 delay may cause harm, P3 low, elective gyn, P4 lowest)

## 2019-11-20 NOTE — Telephone Encounter (Signed)
Called to schedule LEEP with [facility]   DOS 8/5  H&P 7/26 @ 2:10   Covid testing 8/3 @ 8-10:30, [facility] [employer], drive up and wear mask. Advised pt to quarantine until DOS.  Pre-admit phone call appointment to be requested - date and time will be included on H&P paper work. Also all appointments will be updated on pt MyChart. Explained that this appointment has a call window. Based on the time scheduled will indicate if the call will be received within a 4 hour window before 1:00 or after.  Advised that pt may also receive calls from the hospital pharmacy and pre-service center.  Confirmed pt has [place] [employer] as primary insurance. I adv pt to contact her case worker to have her plan changed as [place] is out of network with her current plan. No secondary insurance.

## 2019-11-20 NOTE — Telephone Encounter (Signed)
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  Pt agrees.  Surgery Booking Request Patient Full Name:  Margaret [name] Given  MRN: [id number]  DOB: 01/03/1986  Surgeon: Letitia Libra, MD  Requested Surgery Date and Time: Aug 5 Primary Diagnosis AND Code: CIN III Secondary Diagnosis and Code:  Surgical Procedure: LEEP L&D Notification: No Admission Status: same day surgery Length of Surgery: 20 min Special Case Needs: No H&P: Yes Phone Interview???:  Yes Interpreter: No Language:  Medical Clearance:  No Special Scheduling Instructions: none Any known health/anesthesia issues, diabetes, sleep apnea, latex allergy, defibrillator/pacemaker?: No Acuity: P2 (cancer risk)   (P1 highest, P2 delay may cause harm, P3 low, elective gyn, P4 lowest)

## 2019-12-07 ENCOUNTER — Ambulatory Visit (INDEPENDENT_AMBULATORY_CARE_PROVIDER_SITE_OTHER): Payer: Medicaid Other | Admitting: Obstetrics & Gynecology

## 2019-12-07 ENCOUNTER — Encounter: Payer: Self-pay | Admitting: Obstetrics & Gynecology

## 2019-12-07 ENCOUNTER — Other Ambulatory Visit: Payer: Self-pay

## 2019-12-07 VITALS — BP 120/80 | Ht 67.0 in | Wt 156.0 lb

## 2019-12-07 DIAGNOSIS — D069 Carcinoma in situ of cervix, unspecified: Secondary | ICD-10-CM

## 2019-12-07 NOTE — Patient Instructions (Signed)
Loop Electrosurgical Excision Procedure Loop electrosurgical excision procedure (LEEP) is the cutting and removal (excision) of tissue from the cervix. The cervix is the bottom part of the uterus that opens into the vagina. The tissue that is removed from the cervix is examined to see if there are precancerous cells or cancer cells present. LEEP may be done when:  You have abnormal bleeding from your cervix.  You have an abnormal Pap test result.  Your health care provider finds an abnormality on your cervix during a pelvic exam. LEEP typically only takes a few minutes and is often done in the health care provider's office. The procedure is safe for women who are trying to get pregnant. However, the procedure is usually not done during a menstrual period or during pregnancy. Tell a health care provider about:  Any allergies you have.  All medicines you are taking, including vitamins, herbs, eye drops, creams, and over-the-counter medicines.  Any blood disorders you have.  Any medical conditions you have, including current or past vaginal infections such as herpes or sexually-transmitted infections (STIs).  Whether you are pregnant or may be pregnant.  Whether or not you are having vaginal bleeding on the day of the procedure. What are the risks? Generally, this is a safe procedure. However, problems may occur, including:  Infection.  Bleeding.  Allergic reactions to medicines.  Changes or scarring in the cervix.  Increased risk of early (preterm) labor in future pregnancies. What happens before the procedure?  Ask your health care provider about: ? Changing or stopping your regular medicines. This is especially important if you are taking diabetes medicines or blood thinners. ? Taking medicines such as aspirin and ibuprofen. These medicines can thin your blood. Do not take these medicines unless your health care provider tells you to take them. ? Taking over-the-counter  medicines, vitamins, herbs, and supplements.  Your health care provider may recommend that you take pain medicine before the procedure.  Ask your health care provider if you should plan to have someone take you home after the procedure. What happens during the procedure?   An instrument called a speculum will be placed in your vagina. This will allow your health care provider to see your cervix.  You will be given a medicine to numb the area (local anesthetic). The medicine will be injected into your cervix and the surrounding area.  A solution will be applied to your cervix. This solution will help the health care provider find the abnormal cells that need to be removed.  A thin wire loop will be passed through your vagina. The wire will be used to burn (cauterize) the cervical tissue with an electrical current.  You may feel faint during the procedure. Tell your health care provider right away if you feel this way.  The abnormal cervical tissue will be removed.  Any open blood vessels will be cauterized to prevent bleeding.  A paste may be applied to the cauterized area of your cervix to help prevent bleeding.  The sample of cervical tissue will be examined under a microscope. The procedure may vary among health care providers and hospitals. What can I expect after the procedure? After the procedure, it is common to have:  Mild abdominal cramps that are similar to menstrual cramps. These may last for up to 1 week.  A small amount of pink-tinged or bloody vaginal discharge, including light to moderate bleeding, for 1-2 weeks.  A dark-colored discharge coming from your vagina. This is from   the paste that was used on the cervix to prevent bleeding. It is up to you to get the results of your procedure. Ask your health care provider, or the department that is doing the procedure, when your results will be ready. Follow these instructions at home:  Take over-the-counter and  prescription medicines only as told by your health care provider.  Return to your normal activities as told by your health care provider. Ask your health care provider what activities are safe for you.  Do not put anything in your vagina for 2 weeks after the procedure or until your health care provider says that it is okay. This includes tampons, creams, and douches.  Do not have sex until your health care provider approves.  Keep all follow-up visits as told by your health care provider. This is important. Contact a health care provider if you:  Have a fever or chills.  Feel unusually weak.  Have vaginal bleeding that is heavier or longer than a normal menstrual cycle. A sign of this can be soaking a pad with blood or bleeding with clots.  Develop a bad smelling vaginal discharge.  Have severe abdominal pain or cramping. Summary  Loop electrosurgical excision procedure (LEEP) is the removal of tissue from the cervix. The removed tissue will be checked for precancerous cells or cancer cells.  LEEP typically only takes a few minutes and is often done in the health care provider's office.  Do not put anything in your vagina for 2 weeks after the procedure or until your health care provider says that it is okay. This includes tampons, creams, and douches.  Keep all follow-up visits as told by your health care provider. Ask your health care provider, or the department that is doing the procedure, when your results will be ready. This information is not intended to replace advice given to you by your health care provider. Make sure you discuss any questions you have with your health care provider. Document Revised: 05/23/2018 Document Reviewed: 05/23/2018 Elsevier Patient Education  2020 Elsevier Inc.  

## 2019-12-07 NOTE — H&P (View-Only) (Signed)
   PRE-OPERATIVE HISTORY AND PHYSICAL EXAM  HPI:  Margaret Margaret [name] is a 34 y.o. 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   Recent Biopsies after a LGSIL PAP: FINAL MICROSCOPIC DIAGNOSIS:  A. ENDOCERVIX, CURETTAGE:  - High grade squamous intraepithelial lesion, CIN-III (severe  dysplasia/CIS).   B. CERVIX, 6 O'CLOCK, BIOPSY:  - High grade squamous intraepithelial lesion, CIN-III (severe  dysplasia/CIS).   C. CERVIX, 12 O'CLOCK, BIOPSY:  - High grade squamous intraepithelial lesion, CIN-III (severe  dysplasia/CIS).   PMHx: Past Medical History:  Diagnosis Date  . Vaginal Pap smear, abnormal    Past Surgical History:  Procedure Laterality Date  . TOOTH EXTRACTION     History reviewed. No pertinent family history. Social History   Tobacco Use  . Smoking status: Former Smoker    Packs/day: 0.00    Years: 0.00    Pack years: 0.00    Quit date: 05/10/2014    Years since quitting: 5.5  . Smokeless tobacco: Never Used  Vaping Use  . Vaping Use: Never used  Substance Use Topics  . Alcohol use: No  . Drug use: No    Current Outpatient Medications:  .  etonogestrel (NEXPLANON) 68 MG IMPL implant, 1 each by Subdermal route once., Disp: , Rfl:  Allergies: Patient has no known allergies.  Review of Systems  Constitutional: Negative for chills, fever and malaise/fatigue.  HENT: Negative for congestion, sinus pain and sore throat.   Eyes: Negative for blurred vision and pain.  Respiratory: Negative for cough and wheezing.   Cardiovascular: Negative for chest pain and leg swelling.  Gastrointestinal: Negative for abdominal pain, constipation, diarrhea, heartburn, nausea and vomiting.  Genitourinary: Negative for dysuria, frequency, hematuria and urgency.  Musculoskeletal: Negative for back pain, joint pain, myalgias and neck pain.  Skin: Negative for itching and rash.  Neurological:  Negative for dizziness, tremors and weakness.  Endo/Heme/Allergies: Does not bruise/bleed easily.  Psychiatric/Behavioral: Negative for depression. The patient is not nervous/anxious and does not have insomnia.     Objective: BP 120/80   Ht 5' 7" (1.702 m)   Wt 156 lb (70.8 kg)   LMP 11/11/2019   BMI 24.43 kg/m   Filed Weights   12/07/19 1414  Weight: 156 lb (70.8 kg)   Physical Exam Constitutional:      General: She is not in acute distress.    Appearance: She is well-developed.  HENT:     Head: Normocephalic and atraumatic. No laceration.     Right Ear: Hearing normal.     Left Ear: Hearing normal.     Mouth/Throat:     Pharynx: Uvula midline.  Eyes:     Pupils: Pupils are equal, round, and reactive to light.  Neck:     Thyroid: No thyromegaly.  Cardiovascular:     Rate and Rhythm: Normal rate and regular rhythm.     Heart sounds: No murmur heard.  No friction rub. No gallop.   Pulmonary:     Effort: Pulmonary effort is normal. No respiratory distress.     Breath sounds: Normal breath sounds. No wheezing.  Chest:     Breasts:        Right: No mass, skin change or tenderness.        Left: No mass, skin change or tenderness.  Abdominal:     General: Bowel sounds are normal. There is no distension.     Palpations: Abdomen   is soft.     Tenderness: There is no abdominal tenderness. There is no rebound.  Musculoskeletal:        General: Normal range of motion.     Cervical back: Normal range of motion and neck supple.  Neurological:     Mental Status: She is alert and oriented to person, place, and time.     Cranial Nerves: No cranial nerve deficit.  Skin:    General: Skin is warm and dry.  Psychiatric:        Judgment: Judgment normal.  Vitals reviewed.     Assessment: 1. CIN III (cervical intraepithelial neoplasia grade III) with severe dysplasia   Plan LEEP w D&C  I have had a careful discussion with this patient about all the options available and the  risk/benefits of each. I have fully informed this patient that surgery may subject her to a variety of discomforts and risks: She understands that most patients have surgery with little difficulty, but problems can happen ranging from minor to fatal. These include nausea, vomiting, pain, bleeding, infection, poor healing, hernia, or formation of adhesions. Unexpected reactions may occur from any drug or anesthetic Margaret [name]. Unintended injury may occur to other pelvic or abdominal structures such as Fallopian tubes, ovaries, bladder, ureter (tube from kidney to bladder), or bowel. Nerves going from the pelvis to the legs may be injured. Any such injury may require immediate or later additional surgery to correct the problem. Excessive blood loss requiring transfusion is very unlikely but possible. Dangerous blood clots may form in the legs or lungs. Physical and sexual activity will be restricted in varying degrees for an indeterminate period of time but most often 2-6 weeks.  Finally, she understands that it is impossible to list every possible undesirable effect and that the condition for which surgery is done is not always cured or significantly improved, and in rare cases may be even worse.Ample time was Margaret [name] to answer all questions.  Margaret Major, MD, [name], [facility] 12/07/2019  2:30 PM

## 2019-12-07 NOTE — Progress Notes (Signed)
PRE-OPERATIVE HISTORY AND PHYSICAL EXAM  HPI:  Margaret [name] Given is a 34 y.o. [id number] Patient's last menstrual period was 11/11/2019.; she is being admitted for surgery related to CIN III cervical dysplasia.   Recent Biopsies after a LGSIL PAP: FINAL MICROSCOPIC DIAGNOSIS:  A. ENDOCERVIX, CURETTAGE:  - High grade squamous intraepithelial lesion, CIN-III (severe  dysplasia/CIS).   B. CERVIX, 6 O'CLOCK, BIOPSY:  - High grade squamous intraepithelial lesion, CIN-III (severe  dysplasia/CIS).   C. CERVIX, 12 O'CLOCK, BIOPSY:  - High grade squamous intraepithelial lesion, CIN-III (severe  dysplasia/CIS).   PMHx: Past Medical History:  Diagnosis Date  . Vaginal Pap smear, abnormal    Past Surgical History:  Procedure Laterality Date  . TOOTH EXTRACTION     History reviewed. No pertinent family history. Social History   Tobacco Use  . Smoking status: Former Smoker    Packs/day: 0.00    Years: 0.00    Pack years: 0.00    Quit date: 05/10/2014    Years since quitting: 5.5  . Smokeless tobacco: Never Used  Vaping Use  . Vaping Use: Never used  Substance Use Topics  . Alcohol use: No  . Drug use: No    Current Outpatient Medications:  .  etonogestrel (NEXPLANON) 68 MG IMPL implant, 1 each by Subdermal route once., Disp: , Rfl:  Allergies: Patient has no known allergies.  Review of Systems  Constitutional: Negative for chills, fever and malaise/fatigue.  HENT: Negative for congestion, sinus pain and sore throat.   Eyes: Negative for blurred vision and pain.  Respiratory: Negative for cough and wheezing.   Cardiovascular: Negative for chest pain and leg swelling.  Gastrointestinal: Negative for abdominal pain, constipation, diarrhea, heartburn, nausea and vomiting.  Genitourinary: Negative for dysuria, frequency, hematuria and urgency.  Musculoskeletal: Negative for back pain, joint pain, myalgias and neck pain.  Skin: Negative for itching and rash.  Neurological:  Negative for dizziness, tremors and weakness.  Endo/Heme/Allergies: Does not bruise/bleed easily.  Psychiatric/Behavioral: Negative for depression. The patient is not nervous/anxious and does not have insomnia.     Objective: BP 120/80   Ht 5\' 7"  (1.702 m)   Wt 156 lb (70.8 kg)   LMP 11/11/2019   BMI 24.43 kg/m   Filed Weights   12/07/19 1414  Weight: 156 lb (70.8 kg)   Physical Exam Constitutional:      General: She is not in acute distress.    Appearance: She is well-developed.  HENT:     Head: Normocephalic and atraumatic. No laceration.     Right Ear: Hearing normal.     Left Ear: Hearing normal.     Mouth/Throat:     Pharynx: Uvula midline.  Eyes:     Pupils: Pupils are equal, round, and reactive to light.  Neck:     Thyroid: No thyromegaly.  Cardiovascular:     Rate and Rhythm: Normal rate and regular rhythm.     Heart sounds: No murmur heard.  No friction rub. No gallop.   Pulmonary:     Effort: Pulmonary effort is normal. No respiratory distress.     Breath sounds: Normal breath sounds. No wheezing.  Chest:     Breasts:        Right: No mass, skin change or tenderness.        Left: No mass, skin change or tenderness.  Abdominal:     General: Bowel sounds are normal. There is no distension.     Palpations: Abdomen  is soft.     Tenderness: There is no abdominal tenderness. There is no rebound.  Musculoskeletal:        General: Normal range of motion.     Cervical back: Normal range of motion and neck supple.  Neurological:     Mental Status: She is alert and oriented to person, place, and time.     Cranial Nerves: No cranial nerve deficit.  Skin:    General: Skin is warm and dry.  Psychiatric:        Judgment: Judgment normal.  Vitals reviewed.     Assessment: 1. CIN III (cervical intraepithelial neoplasia grade III) with severe dysplasia   Plan LEEP w D&C  I have had a careful discussion with this patient about all the options available and the  risk/benefits of each. I have fully informed this patient that surgery may subject her to a variety of discomforts and risks: She understands that most patients have surgery with little difficulty, but problems can happen ranging from minor to fatal. These include nausea, vomiting, pain, bleeding, infection, poor healing, hernia, or formation of adhesions. Unexpected reactions may occur from any drug or anesthetic given. Unintended injury may occur to other pelvic or abdominal structures such as Fallopian tubes, ovaries, bladder, ureter (tube from kidney to bladder), or bowel. Nerves going from the pelvis to the legs may be injured. Any such injury may require immediate or later additional surgery to correct the problem. Excessive blood loss requiring transfusion is very unlikely but possible. Dangerous blood clots may form in the legs or lungs. Physical and sexual activity will be restricted in varying degrees for an indeterminate period of time but most often 2-6 weeks.  Finally, she understands that it is impossible to list every possible undesirable effect and that the condition for which surgery is done is not always cured or significantly improved, and in rare cases may be even worse.Ample time was given to answer all questions.  Annamarie Major, MD, [name], [facility] 12/07/2019  2:30 PM

## 2019-12-09 ENCOUNTER — Encounter
Admission: RE | Admit: 2019-12-09 | Discharge: 2019-12-09 | Disposition: A | Discharge status: Home / Self Care | Payer: Medicaid Other | Source: Ambulatory Visit | Attending: Obstetrics & Gynecology | Admitting: Obstetrics & Gynecology

## 2019-12-09 ENCOUNTER — Other Ambulatory Visit: Payer: Self-pay

## 2019-12-09 NOTE — Patient Instructions (Signed)
COVID TESTING Date: December 15, 2019 Testing site:  [facility] [address] Thru Hours:  3:87 am - 1:00 pm Once you are tested, you are asked to stay quarantined (avoiding public places) until after your surgery.   Your procedure is scheduled on: Thursday December 17, 2019 Report to Day Surgery on the 2nd floor of the [facility]. To find out your arrival time, please call [phone] between 1PM - 3PM on: Wednesday December 16, 2019  REMEMBER: Instructions that are not followed completely may result in serious medical risk, up to and including death; or upon the discretion of your surgeon and anesthesiologist your surgery may need to be rescheduled.  Do not eat food after midnight the night before surgery.  No gum chewing, lozengers or hard candies.  You may however, drink CLEAR liquids up to 2 hours before you are scheduled to arrive for your surgery. Do not drink anything within 2 hours of your scheduled arrival time.  Clear liquids include: - water  - apple juice without pulp - gatorade (not RED) - black coffee or tea (Do NOT add milk or creamers to the coffee or tea) Do NOT drink anything that is not on this list.  Type 1 and Type 2 diabetics should only drink water.   TAKE THESE MEDICATIONS THE MORNING OF SURGERY WITH A SIP OF WATER: none  Stop Anti-inflammatories (NSAIDS) such as Advil, Aleve, Ibuprofen, Motrin, Naproxen, Naprosyn and ASPIRIN AND Aspirin based products such as Excedrin, Goodys Powder, BC Powder. (May take Tylenol or Acetaminophen if needed.)  Stop ANY OVER THE COUNTER supplements until after surgery. (May continue Vitamin D, Vitamin B, and multivitamin.)  No Alcohol for 24 hours before or after surgery.  No Smoking including e-cigarettes for 24 hours prior to surgery.  No chewable tobacco products for at least 6 hours prior to surgery.  No nicotine patches on the day of surgery.  Do not use any "recreational" drugs for at  least a week prior to your surgery.  Please be advised that the combination of cocaine and anesthesia may have negative outcomes, up to and including death. If you test positive for cocaine, your surgery will be cancelled.  On the morning of surgery brush your teeth with toothpaste and water, you may rinse your mouth with mouthwash if you wish. Do not swallow any toothpaste or mouthwash.  Do not wear jewelry, make-up, hairpins, clips or nail polish.  Do not wear lotions, powders, or perfumes.   Do not shave 48 hours prior to surgery.   Contact lenses, hearing aids and dentures may not be worn into surgery.  Do not bring valuables to the hospital. [facility] is not responsible for any missing/lost belongings or valuables.   SHOWER THE DAY OF SURGERY.  Notify your doctor if there is any change in your medical condition (cold, fever, infection).  Wear comfortable clothing (specific to your surgery type) to the hospital.  Plan for stool softeners for home use; pain medications have a tendency to cause constipation. You can also help prevent constipation by eating foods high in fiber such as fruits and vegetables and drinking plenty of fluids as your diet allows.  After surgery, you can help prevent lung complications by doing breathing exercises.  Take deep breaths and cough every 1-2 hours. Your doctor may order a device called an Incentive Spirometer to help you take deep breaths. When coughing or sneezing, hold a pillow firmly against your incision with both hands.  This is called "splinting." Doing this helps protect your incision. It also decreases belly discomfort.   If you are being discharged the day of surgery, you will not be allowed to drive home. You will need a responsible adult (18 years or older) to drive you home and stay with you that night.   If you are taking public transportation, you will need to have a responsible adult (18 years or older) with you. Please confirm  with your physician that it is acceptable to use public transportation.   Please call the Pre-admissions Testing Dept. at [phone] if you have any questions about these instructions.  Visitation Policy:  Patients undergoing a surgery or procedure may have one family member or support person with them as long as that person is not COVID-19 positive or experiencing its symptoms.  That person may remain in the waiting area during the procedure.  Children under 66 years of age may have both parents or legal guardians with them during their procedure.  Inpatient Visitation Update:   Two designated support people may visit a patient during visiting hours 7 am to 8 pm. It must be the same two designated people for the duration of the patient stay. The visitors may come and go during the day, and there is no switching out to have different visitors. A mask must be worn at all times, including in the patient room.  Children under 4 years of age:  a total of 4 designated visitors for the child's entire stay are allowed. Only 2 in the room at a time and only one staying overnight at a time. The overnight guest can now rotate during the child's hospital stay.  As a reminder, masks are still required for all  team members, patients and visitors in all [facility] facilities.   Systemwide, no visitors 17 or younger.

## 2019-12-15 ENCOUNTER — Other Ambulatory Visit
Admission: RE | Admit: 2019-12-15 | Discharge: 2019-12-15 | Disposition: A | Discharge status: Home / Self Care | Payer: Medicaid Other | Source: Ambulatory Visit | Attending: Obstetrics & Gynecology | Admitting: Obstetrics & Gynecology

## 2019-12-15 ENCOUNTER — Other Ambulatory Visit: Payer: Self-pay

## 2019-12-15 DIAGNOSIS — Z20822 Contact with and (suspected) exposure to covid-19: Secondary | ICD-10-CM | POA: Insufficient documentation

## 2019-12-15 DIAGNOSIS — Z01812 Encounter for preprocedural laboratory examination: Secondary | ICD-10-CM | POA: Insufficient documentation

## 2019-12-15 LAB — CBC
HCT: 35.5 % — ABNORMAL LOW (ref 36.0–46.0)
Hemoglobin: 12.7 g/dL (ref 12.0–15.0)
MCH: 30.9 pg (ref 26.0–34.0)
MCHC: 35.8 g/dL (ref 30.0–36.0)
MCV: 86.4 fL (ref 80.0–100.0)
Platelets: 190 10*3/uL (ref 150–400)
RBC: 4.11 MIL/uL (ref 3.87–5.11)
RDW: 12.2 % (ref 11.5–15.5)
WBC: 7.7 10*3/uL (ref 4.0–10.5)
nRBC: 0 % (ref 0.0–0.2)

## 2019-12-15 LAB — TYPE AND SCREEN
ABO/RH(D): A POS
Antibody Screen: NEGATIVE

## 2019-12-15 LAB — SARS CORONAVIRUS 2 (TAT 6-24 HRS): SARS Coronavirus 2: NEGATIVE

## 2019-12-17 ENCOUNTER — Ambulatory Visit
Admission: RE | Admit: 2019-12-17 | Discharge: 2019-12-17 | Disposition: A | Discharge status: Home / Self Care | Payer: Medicaid Other | Source: Ambulatory Visit | Attending: Obstetrics & Gynecology | Admitting: Obstetrics & Gynecology

## 2019-12-17 ENCOUNTER — Encounter
Admission: RE | Disposition: A | Discharge status: Home / Self Care | Payer: Self-pay | Source: Ambulatory Visit | Attending: Obstetrics & Gynecology

## 2019-12-17 ENCOUNTER — Ambulatory Visit: Payer: Medicaid Other | Admitting: Certified Registered Nurse Anesthetist

## 2019-12-17 ENCOUNTER — Other Ambulatory Visit: Payer: Self-pay

## 2019-12-17 ENCOUNTER — Encounter: Payer: Self-pay | Admitting: Obstetrics & Gynecology

## 2019-12-17 DIAGNOSIS — Z87891 Personal history of nicotine dependence: Secondary | ICD-10-CM | POA: Insufficient documentation

## 2019-12-17 DIAGNOSIS — D069 Carcinoma in situ of cervix, unspecified: Secondary | ICD-10-CM | POA: Diagnosis present

## 2019-12-17 HISTORY — PX: LEEP: SHX91

## 2019-12-17 LAB — POCT PREGNANCY, URINE: Preg Test, Ur: NEGATIVE

## 2019-12-17 SURGERY — LEEP (LOOP ELECTROSURGICAL EXCISION PROCEDURE)
Anesthesia: General

## 2019-12-17 MED ORDER — LIDOCAINE HCL (CARDIAC) PF 100 MG/5ML IV SOSY
PREFILLED_SYRINGE | INTRAVENOUS | Status: DC | PRN
  Administered 2019-12-17: 60 mg via INTRAVENOUS

## 2019-12-17 MED ORDER — MIDAZOLAM HCL 2 MG/2ML IJ SOLN
INTRAMUSCULAR | Status: DC | PRN
  Administered 2019-12-17: 2 mg via INTRAVENOUS

## 2019-12-17 MED ORDER — POVIDONE-IODINE 10 % EX SWAB: 2.0000 "application " | Freq: Once | CUTANEOUS | Status: DC

## 2019-12-17 MED ORDER — FENTANYL CITRATE (PF) 100 MCG/2ML IJ SOLN
INTRAMUSCULAR | Status: AC
  Filled 2019-12-17: qty 2

## 2019-12-17 MED ORDER — ACETAMINOPHEN 325 MG PO TABS: 325.0000 mg | ORAL_TABLET | ORAL | Status: DC | PRN

## 2019-12-17 MED ORDER — LIDOCAINE-EPINEPHRINE 1 %-1:100000 IJ SOLN
INTRAMUSCULAR | Status: DC | PRN
  Administered 2019-12-17: 20 mL

## 2019-12-17 MED ORDER — ONDANSETRON HCL 4 MG/2ML IJ SOLN
INTRAMUSCULAR | Status: AC
  Filled 2019-12-17: qty 2

## 2019-12-17 MED ORDER — IODINE STRONG (LUGOLS) 5 % PO SOLN
ORAL | Status: AC
  Filled 2019-12-17: qty 1

## 2019-12-17 MED ORDER — ORAL CARE MOUTH RINSE
15.0000 mL | Freq: Once | OROMUCOSAL | Status: AC
  Administered 2019-12-17: 15 mL via OROMUCOSAL

## 2019-12-17 MED ORDER — PROPOFOL 10 MG/ML IV BOLUS
INTRAVENOUS | Status: DC | PRN
  Administered 2019-12-17: 50 mg via INTRAVENOUS

## 2019-12-17 MED ORDER — FERRIC SUBSULFATE 259 MG/GM EX SOLN
CUTANEOUS | Status: AC
  Filled 2019-12-17: qty 8

## 2019-12-17 MED ORDER — ACETAMINOPHEN 160 MG/5ML PO SOLN
325.0000 mg | ORAL | Status: DC | PRN
  Filled 2019-12-17: qty 20.3

## 2019-12-17 MED ORDER — HYDROCODONE-ACETAMINOPHEN 7.5-325 MG PO TABS
1.0000 | ORAL_TABLET | Freq: Once | ORAL | Status: DC | PRN
  Filled 2019-12-17: qty 1

## 2019-12-17 MED ORDER — MIDAZOLAM HCL 2 MG/2ML IJ SOLN
INTRAMUSCULAR | Status: AC
  Filled 2019-12-17: qty 2

## 2019-12-17 MED ORDER — ONDANSETRON HCL 4 MG/2ML IJ SOLN
INTRAMUSCULAR | Status: DC | PRN
  Administered 2019-12-17: 4 mg via INTRAVENOUS

## 2019-12-17 MED ORDER — PROMETHAZINE HCL 25 MG/ML IJ SOLN: 6.2500 mg | INTRAMUSCULAR | Status: DC | PRN

## 2019-12-17 MED ORDER — ACETAMINOPHEN 325 MG PO TABS: 650.0000 mg | ORAL_TABLET | ORAL | Status: DC | PRN

## 2019-12-17 MED ORDER — LACTATED RINGERS IV SOLN: INTRAVENOUS | Status: DC

## 2019-12-17 MED ORDER — FENTANYL CITRATE (PF) 100 MCG/2ML IJ SOLN: 25.0000 ug | INTRAMUSCULAR | Status: DC | PRN

## 2019-12-17 MED ORDER — ACETAMINOPHEN 650 MG RE SUPP
650.0000 mg | RECTAL | Status: DC | PRN
  Filled 2019-12-17: qty 1

## 2019-12-17 MED ORDER — PROPOFOL 500 MG/50ML IV EMUL
INTRAVENOUS | Status: DC | PRN
  Administered 2019-12-17: 150 ug/kg/min via INTRAVENOUS

## 2019-12-17 MED ORDER — LIDOCAINE HCL (PF) 2 % IJ SOLN
INTRAMUSCULAR | Status: AC
  Filled 2019-12-17: qty 5

## 2019-12-17 MED ORDER — OXYCODONE-ACETAMINOPHEN 5-325 MG PO TABS: 1.0000 | ORAL_TABLET | ORAL | Status: DC | PRN

## 2019-12-17 MED ORDER — KETOROLAC TROMETHAMINE 30 MG/ML IJ SOLN
INTRAMUSCULAR | Status: AC
  Filled 2019-12-17: qty 1

## 2019-12-17 MED ORDER — LIDOCAINE-EPINEPHRINE 1 %-1:100000 IJ SOLN
INTRAMUSCULAR | Status: AC
  Filled 2019-12-17: qty 1

## 2019-12-17 MED ORDER — CHLORHEXIDINE GLUCONATE 0.12 % MT SOLN: 15.0000 mL | Freq: Once | OROMUCOSAL | Status: AC

## 2019-12-17 MED ORDER — FAMOTIDINE 20 MG PO TABS
ORAL_TABLET | ORAL | Status: AC
  Administered 2019-12-17: 20 mg via ORAL
  Filled 2019-12-17: qty 1

## 2019-12-17 MED ORDER — DEXAMETHASONE SODIUM PHOSPHATE 4 MG/ML IJ SOLN
INTRAMUSCULAR | Status: DC | PRN
  Administered 2019-12-17: 8 mg via INTRAVENOUS

## 2019-12-17 MED ORDER — PROPOFOL 500 MG/50ML IV EMUL
INTRAVENOUS | Status: AC
  Filled 2019-12-17: qty 50

## 2019-12-17 MED ORDER — KETOROLAC TROMETHAMINE 30 MG/ML IJ SOLN: 30.0000 mg | Freq: Once | INTRAMUSCULAR | Status: DC | PRN

## 2019-12-17 MED ORDER — DEXAMETHASONE SODIUM PHOSPHATE 10 MG/ML IJ SOLN
INTRAMUSCULAR | Status: AC
  Filled 2019-12-17: qty 1

## 2019-12-17 MED ORDER — MORPHINE SULFATE (PF) 2 MG/ML IV SOLN: 1.0000 mg | INTRAVENOUS | Status: DC | PRN

## 2019-12-17 MED ORDER — CHLORHEXIDINE GLUCONATE 0.12 % MT SOLN
OROMUCOSAL | Status: AC
  Filled 2019-12-17: qty 15

## 2019-12-17 MED ORDER — DROPERIDOL 2.5 MG/ML IJ SOLN
0.6250 mg | Freq: Once | INTRAMUSCULAR | Status: DC | PRN
  Filled 2019-12-17: qty 2

## 2019-12-17 MED ORDER — OXYCODONE-ACETAMINOPHEN 5-325 MG PO TABS: 1.0000 | ORAL_TABLET | ORAL | 0 refills | Status: DC | PRN

## 2019-12-17 MED ORDER — FAMOTIDINE 20 MG PO TABS: 20.0000 mg | ORAL_TABLET | Freq: Once | ORAL | Status: AC

## 2019-12-17 MED ORDER — FENTANYL CITRATE (PF) 100 MCG/2ML IJ SOLN
INTRAMUSCULAR | Status: DC | PRN
  Administered 2019-12-17 (×2): 25 ug via INTRAVENOUS

## 2019-12-17 SURGICAL SUPPLY — 35 items
APPLICATOR COTTON TIP 6IN STRL (MISCELLANEOUS) ×3 IMPLANT
BAG COUNTER SPONGE EZ (MISCELLANEOUS) ×2 IMPLANT
BAG SPNG 4X4 CLR HAZ (MISCELLANEOUS) ×1
CANISTER SUCT 1200ML W/VALVE (MISCELLANEOUS) ×3 IMPLANT
CATH ROBINSON RED A/P 16FR (CATHETERS) ×3 IMPLANT
CNTNR SPEC 2.5X3XGRAD LEK (MISCELLANEOUS) ×2
CONT SPEC 4OZ STER OR WHT (MISCELLANEOUS) ×4
CONT SPEC 4OZ STRL OR WHT (MISCELLANEOUS) ×2
CONTAINER SPEC 2.5X3XGRAD LEK (MISCELLANEOUS) ×2 IMPLANT
COUNTER SPONGE BAG EZ (MISCELLANEOUS) ×1
COVER WAND RF STERILE (DRAPES) ×3 IMPLANT
ELECT LEEP BALL 5MM 12CM (MISCELLANEOUS) ×3
ELECT LEEP LOOP 20X10 R2010 (MISCELLANEOUS) ×3
ELECT LOOP 1.0X1.0CM R1010 (MISCELLANEOUS) ×3
ELECT REM PT RETURN 9FT ADLT (ELECTROSURGICAL) ×3
ELECTRODE LEEP BALL 5MM 12CM (MISCELLANEOUS) ×1 IMPLANT
ELECTRODE LEP LOOP 20X10 R2010 (MISCELLANEOUS) ×1 IMPLANT
ELECTRODE LOOP 1.0X1.0CM R1010 (MISCELLANEOUS) ×1 IMPLANT
ELECTRODE REM PT RTRN 9FT ADLT (ELECTROSURGICAL) ×1 IMPLANT
GLOVE BIO SURGEON STRL SZ8 (GLOVE) ×3 IMPLANT
GOWN STRL REUS W/ TWL LRG LVL3 (GOWN DISPOSABLE) ×1 IMPLANT
GOWN STRL REUS W/ TWL XL LVL3 (GOWN DISPOSABLE) ×1 IMPLANT
GOWN STRL REUS W/TWL LRG LVL3 (GOWN DISPOSABLE) ×3
GOWN STRL REUS W/TWL XL LVL3 (GOWN DISPOSABLE) ×3
NEEDLE SPNL 22GX3.5 QUINCKE BK (NEEDLE) ×3 IMPLANT
NS IRRIG 500ML POUR BTL (IV SOLUTION) ×3 IMPLANT
PACK DNC HYST (MISCELLANEOUS) ×3 IMPLANT
PAD OB MATERNITY 4.3X12.25 (PERSONAL CARE ITEMS) ×3 IMPLANT
PAD PREP 24X41 OB/GYN DISP (PERSONAL CARE ITEMS) ×3 IMPLANT
PENCIL ELECTRO HAND CTR (MISCELLANEOUS) ×3 IMPLANT
STRAP SAFETY 5IN WIDE (MISCELLANEOUS) ×3 IMPLANT
STRAW SMOKE EVAC LEEP 6150 NON (MISCELLANEOUS) ×3 IMPLANT
SYR CONTROL 10ML LL (SYRINGE) ×3 IMPLANT
TUBING CONNECTING 10 (TUBING) ×2 IMPLANT
TUBING CONNECTING 10' (TUBING) ×1

## 2019-12-17 NOTE — Anesthesia Preprocedure Evaluation (Addendum)
Anesthesia Evaluation  Patient identified by MRN, date of birth, ID band Patient awake    Reviewed: Allergy & Precautions, H&P , NPO status , Patient's Chart, lab work & pertinent test results, reviewed documented beta blocker date and time   Airway Mallampati: II  TM Distance: >3 FB Neck ROM: full    Dental  (+) Teeth Intact   Pulmonary neg pulmonary ROS, former smoker,    Pulmonary exam normal        Cardiovascular negative cardio ROS Normal cardiovascular exam     Neuro/Psych negative neurological ROS     GI/Hepatic negative GI ROS, Neg liver ROS, neg GERD  ,  Endo/Other  negative endocrine ROS  Renal/GU negative Renal ROS     Musculoskeletal negative musculoskeletal ROS (+)   Abdominal   Peds  Hematology negative hematology ROS (+)   Anesthesia Other Findings Past Medical History: No date: Vaginal Pap smear, abnormal  Past Surgical History: No date: TOOTH EXTRACTION     Reproductive/Obstetrics                            Anesthesia Physical Anesthesia Plan  ASA: II  Anesthesia Plan: General   Post-op Pain Management:    Induction: Intravenous  PONV Risk Score and Plan: Treatment may vary due to age or medical condition and TIVA  Airway Management Planned: Nasal Cannula and Natural Airway  Additional Equipment:   Intra-op Plan:   Post-operative Plan:   Informed Consent: I have reviewed the patients History and Physical, chart, labs and discussed the procedure including the risks, benefits and alternatives for the proposed anesthesia with the patient or authorized representative who has indicated his/her understanding and acceptance.     Dental Advisory Given  Plan Discussed with: CRNA  Anesthesia Plan Comments:         Anesthesia Quick Evaluation

## 2019-12-17 NOTE — Discharge Instructions (Signed)
Cervical Conization, Care After This sheet gives you information about how to care for yourself after your procedure. Your doctor may also give you more specific instructions. If you have problems or questions, contact your doctor. What can I expect after the procedure? After the procedure, it is common to have:  A groggy feeling, if you were given medicine to make you fall asleep (general anesthetic).  Cramps that feel like menstrual cramps.  Bloody discharge or light to moderate bleeding.  Dark fluid from the vagina (vaginal discharge). This fluid may look similar to coffee grounds. Follow these instructions at home: Medicines  Take over-the-counter and prescription medicines only as told by your doctor.  Do not take aspirin until your doctor says it is okay. Activity   Rest as told by your doctor.  For 7-14 days after your procedure, avoid: ? Being very active. ? Exercising. ? Heavy lifting.  Do not lift anything that is heavier than 10 lb (4.5 kg), or the limit that you are told, until your doctor says that it is safe.  Return to your normal activities as told by your doctor. Ask your doctor what activities are safe for you. General instructions   You may eat your normal diet unless your doctor tells you not to do so.  Drink enough fluid to keep your pee (urine) pale yellow.  Do not take baths, swim, or use a hot tub until your doctor approves. Ask your doctor if you may take showers. You may only be allowed to take sponge baths.  Do not douche, have sex, or put anything in the vagina, including tampons, until your doctor says it is okay.  Keep all follow-up visits as told by your doctor. This is important. Contact a doctor if:  You get a rash.  You are dizzy or light-headed.  You feel like you may vomit (nauseous).  You vomit.  You have fluid from your vagina that smells bad. Get help right away if:  There are bloody clumps (clots) coming from your  vagina.  You have more bleeding than you would have in a normal menstrual period. For example, you soak a pad in less than 1 hour.  You have a fever.  You have more and more cramps.  You pass out (faint).  You have pain when peeing.  You have very bad pain, or your pain gets worse. Medicine does not help your pain.  You have blood in your pee. Summary  After the procedure, it is common to have cramps, some bleeding, and dark or bloody fluid from your vagina.  Do not douche, have sex, or use tampons until your doctor says it is okay.  For about 7-14 days after your procedure, try not to exercise or lift heavy objects. This information is not intended to replace advice given to you by your health care provider. Make sure you discuss any questions you have with your health care provider. Document Revised: 10/27/2018 Document Reviewed: 10/27/2018 Elsevier Patient Education  2020 [employer]   AMBULATORY SURGERY  DISCHARGE INSTRUCTIONS   1) The drugs that you were given will stay in your system until tomorrow so for the next 24 hours you should not:  A) Drive an automobile B) Make any legal decisions C) Drink any alcoholic beverage   2) You may resume regular meals tomorrow.  Today it is better to start with liquids and gradually work up to solid foods.  You may eat anything you prefer, but it is better  start with liquids, then soup and crackers, and gradually work up to solid foods.   3) Please notify your doctor immediately if you have any unusual bleeding, trouble breathing, redness and pain at the surgery site, drainage, fever, or pain not relieved by medication.    4) Additional Instructions:        Please contact your physician with any problems or Same Day Surgery at [phone], Monday through Friday 6 am to 4 pm, or [place] at [place] Main number at [phone].  

## 2019-12-17 NOTE — Transfer of Care (Signed)
Immediate Anesthesia Transfer of Care Note  Patient: Margaret [name]  Procedure(s) Performed: LOOP ELECTROSURGICAL EXCISION PROCEDURE (LEEP) (N/A )  Patient Location: PACU  Anesthesia Type:General  Level of Consciousness: drowsy  Airway & Oxygen Therapy: Patient Spontanous Breathing and Patient connected to face mask oxygen  Post-op Assessment: Report [name] to RN and Post -op Vital signs reviewed and stable  Post vital signs: Reviewed and stable  Last Vitals:  Vitals Value Taken Time  BP 98/54 12/17/19 1219  Temp    Pulse 80 12/17/19 1221  Resp 20 12/17/19 1222  SpO2 100 % 12/17/19 1221  Vitals shown include unvalidated device data.  Last Pain:  Vitals:   12/17/19 1032  TempSrc: Temporal  PainSc: 0-No pain         Complications: No complications documented.

## 2019-12-17 NOTE — Interval H&P Note (Signed)
History and Physical Interval Note:  12/17/2019 11:22 AM  Margaret [name] Given  has presented today for surgery, with the diagnosis of CIN III.  The various methods of treatment have been discussed with the patient and family. After consideration of risks, benefits and other options for treatment, the patient has consented to  Procedure(s): LOOP ELECTROSURGICAL EXCISION PROCEDURE (LEEP) (N/A)  and D&C as a surgical intervention.  The patient's history has been reviewed, patient examined, no change in status, stable for surgery.  I have reviewed the patient's chart and labs.  Questions were answered to the patient's satisfaction.     Letitia Libra

## 2019-12-17 NOTE — Op Note (Signed)
Operative Note   SURGERY DATE: 12/17/2019  PRE-OP DIAGNOSIS: Cervical Dysplasia CIS / CIN III  POST-OP DIAGNOSIS: same  PROCEDURE: Exam under anesthesia, loop electricosurgical excisional procedure, endocervical curettage, dilation and curettage  SURGEON: Annamarie Major, MD, FACOG  ANESTHESIA: Choice   ESTIMATED BLOOD LOSS: Minimal   SPECIMENS: ECC, EMC, Ectocervical LEEP x3, Endomcervical LEEP x2   COMPLICATIONS: none  INDICATIONS: CIN III/ CIS on all biopsies at [facility] after HGSIL PAP  FINDINGS: EGBUS and vagina within normal limits. Cervix grossly showed patulous os and friability.    PROCEDURE IN DETAIL: After informed consent was obtained, the patient was taken to the operating room where general anesthesia was administered without difficulty. The patient was positioned in the dorsal lithotomy position in [place] stirrups. Time-out was performed. The patient was examined under anesthesia, and the above findings were noted. She was prepped and draped in normal sterile fashion.  A weighted speculum and deaver were placed inside the patient's vagina. The above mentioned findings were noted.   An endocervical specimen is obtained using the kevorkian curette.  Copiuos amount of tissue obtained.  An endometrial curettage is performed with a banjo curette with good amount of tissue obtained.   A sound was then used to trace the canal and it's width. Using the LEEP device and loops for ecto- and endo- cervical specimens,  the conization was performed, making sure to encompass the entire lesion and transformation zone and to take a deeper second specimen. The cone bed was then cauterized to achieve hemostasis. Speculum was removed and patient awoken from anesthesia.  The patient tolerated the procedure well. Sponge, lap, needle, and instrument counts were correct x 2. The patient was transferred to the recovery room awake, alert and breathing independently in stable condition.  Annamarie Major, MD,  [name], [facility] 12/17/2019  12:22 PM

## 2019-12-23 ENCOUNTER — Other Ambulatory Visit: Payer: Self-pay

## 2019-12-23 ENCOUNTER — Ambulatory Visit (INDEPENDENT_AMBULATORY_CARE_PROVIDER_SITE_OTHER): Payer: Medicaid Other | Admitting: Obstetrics & Gynecology

## 2019-12-23 ENCOUNTER — Ambulatory Visit: Payer: Medicaid Other | Admitting: Obstetrics & Gynecology

## 2019-12-23 ENCOUNTER — Encounter: Payer: Self-pay | Admitting: Obstetrics & Gynecology

## 2019-12-23 VITALS — BP 120/80 | Ht 68.0 in | Wt 153.0 lb

## 2019-12-23 DIAGNOSIS — Z9889 Other specified postprocedural states: Secondary | ICD-10-CM

## 2019-12-23 NOTE — Progress Notes (Signed)
  Postoperative Follow-up Patient presents post op from LEEP for CIN III, 1 week ago.  Subjective: Patient reports marked improvement in her preop symptoms. Eating a regular diet without difficulty. The patient is not having any pain.  Activity: normal activities of daily living. Patient reports additional symptom's since surgery of None.  Objective: BP 120/80   Ht 5\' 8"  (1.727 m)   Wt 153 lb (69.4 kg)   BMI 23.26 kg/m  Physical Exam Constitutional:      General: She is not in acute distress.    Appearance: She is well-developed.  Musculoskeletal:        General: Normal range of motion.  Neurological:     Mental Status: She is alert and oriented to person, place, and time.  Skin:    General: Skin is warm and dry.  Vitals reviewed.     Assessment: s/p :  LEEP progressing well  Plan: Patient has done well after surgery with no apparent complications.  I have discussed the post-operative course to date, and the expected progress moving forward.  The patient understands what complications to be concerned about.  I will see the patient in routine follow up, or sooner if needed.    Activity plan: No restriction. Path still pending PAP 3 mos if no further surgery required based on path  12/23/2019, 4:46 PM

## 2019-12-24 LAB — SURGICAL PATHOLOGY

## 2019-12-24 NOTE — Anesthesia Postprocedure Evaluation (Signed)
Anesthesia Post Note  Patient: Margaret [name] Given  Procedure(s) Performed: LOOP ELECTROSURGICAL EXCISION PROCEDURE (LEEP) (N/A )  Patient location during evaluation: PACU Anesthesia Type: General Level of consciousness: awake and alert Pain management: pain level controlled Vital Signs Assessment: post-procedure vital signs reviewed and stable Respiratory status: spontaneous breathing, nonlabored ventilation and respiratory function stable Cardiovascular status: blood pressure returned to baseline and stable Postop Assessment: no apparent nausea or vomiting Anesthetic complications: no   No complications documented.   Last Vitals:  Vitals:   12/17/19 1324 12/17/19 1334  BP: (!) 105/58 100/60  Pulse: 62 66  Resp: 20 20  Temp: 36.8 C   SpO2: 100% 100%    Last Pain:  Vitals:   12/17/19 1324  TempSrc:   PainSc: 0-No pain                 Christia Reading

## 2020-03-13 ENCOUNTER — Emergency Department (HOSPITAL_COMMUNITY)
Admission: EM | Admit: 2020-03-13 | Discharge: 2020-03-13 | Disposition: A | Discharge status: Home / Self Care | Payer: Medicaid Other | Attending: Emergency Medicine | Admitting: Emergency Medicine

## 2020-03-13 ENCOUNTER — Other Ambulatory Visit: Payer: Self-pay

## 2020-03-13 ENCOUNTER — Emergency Department (HOSPITAL_COMMUNITY): Payer: Medicaid Other

## 2020-03-13 ENCOUNTER — Encounter (HOSPITAL_COMMUNITY): Payer: Self-pay

## 2020-03-13 DIAGNOSIS — R1031 Right lower quadrant pain: Secondary | ICD-10-CM | POA: Insufficient documentation

## 2020-03-13 DIAGNOSIS — R1032 Left lower quadrant pain: Secondary | ICD-10-CM | POA: Diagnosis not present

## 2020-03-13 DIAGNOSIS — M545 Low back pain, unspecified: Secondary | ICD-10-CM | POA: Insufficient documentation

## 2020-03-13 DIAGNOSIS — Z87891 Personal history of nicotine dependence: Secondary | ICD-10-CM | POA: Diagnosis not present

## 2020-03-13 DIAGNOSIS — R110 Nausea: Secondary | ICD-10-CM | POA: Insufficient documentation

## 2020-03-13 DIAGNOSIS — R109 Unspecified abdominal pain: Secondary | ICD-10-CM

## 2020-03-13 LAB — CBC WITH DIFFERENTIAL/PLATELET
Abs Immature Granulocytes: 0.03 10*3/uL (ref 0.00–0.07)
Basophils Absolute: 0 10*3/uL (ref 0.0–0.1)
Basophils Relative: 0 %
Eosinophils Absolute: 0.4 10*3/uL (ref 0.0–0.5)
Eosinophils Relative: 5 %
HCT: 38.2 % (ref 36.0–46.0)
Hemoglobin: 12.6 g/dL (ref 12.0–15.0)
Immature Granulocytes: 0 %
Lymphocytes Relative: 28 %
Lymphs Abs: 2.4 10*3/uL (ref 0.7–4.0)
MCH: 30.1 pg (ref 26.0–34.0)
MCHC: 33 g/dL (ref 30.0–36.0)
MCV: 91.4 fL (ref 80.0–100.0)
Monocytes Absolute: 0.6 10*3/uL (ref 0.1–1.0)
Monocytes Relative: 7 %
Neutro Abs: 5.1 10*3/uL (ref 1.7–7.7)
Neutrophils Relative %: 60 %
Platelets: 186 10*3/uL (ref 150–400)
RBC: 4.18 MIL/uL (ref 3.87–5.11)
RDW: 12.5 % (ref 11.5–15.5)
WBC: 8.6 10*3/uL (ref 4.0–10.5)
nRBC: 0 % (ref 0.0–0.2)

## 2020-03-13 LAB — URINALYSIS, ROUTINE W REFLEX MICROSCOPIC
Bilirubin Urine: NEGATIVE
Glucose, UA: NEGATIVE mg/dL
Ketones, ur: NEGATIVE mg/dL
Leukocytes,Ua: NEGATIVE
Nitrite: NEGATIVE
Protein, ur: NEGATIVE mg/dL
Specific Gravity, Urine: 1.019 (ref 1.005–1.030)
pH: 7 (ref 5.0–8.0)

## 2020-03-13 LAB — COMPREHENSIVE METABOLIC PANEL
ALT: 11 U/L (ref 0–44)
AST: 11 U/L — ABNORMAL LOW (ref 15–41)
Albumin: 4 g/dL (ref 3.5–5.0)
Alkaline Phosphatase: 33 U/L — ABNORMAL LOW (ref 38–126)
Anion gap: 6 (ref 5–15)
BUN: 15 mg/dL (ref 6–20)
CO2: 24 mmol/L (ref 22–32)
Calcium: 8.6 mg/dL — ABNORMAL LOW (ref 8.9–10.3)
Chloride: 104 mmol/L (ref 98–111)
Creatinine, Ser: 0.49 mg/dL (ref 0.44–1.00)
GFR, Estimated: >60 mL/min (ref >60)
Glucose, Bld: 87 mg/dL (ref 70–99)
Potassium: 4 mmol/L (ref 3.5–5.1)
Sodium: 134 mmol/L — ABNORMAL LOW (ref 135–145)
Total Bilirubin: 0.5 mg/dL (ref 0.3–1.2)
Total Protein: 6.5 g/dL (ref 6.5–8.1)

## 2020-03-13 LAB — PREGNANCY, URINE: Preg Test, Ur: NEGATIVE

## 2020-03-13 NOTE — Discharge Instructions (Signed)
The CT scan that you had today did not identify your left ovary. I have scheduled you to return here tomorrow to have an ultrasound of your pelvis. Call the radiology scheduling department at [phone] to arrange an appointment time to have this procedure done. Also, call the OB/GYN office listed to arrange a follow-up appointment. Return to the emergency department if you develop fever, vomiting, or worsening pain. You may try taking ibuprofen 600-800 mg 3 times a day with food and apply warm heat on and off to your left side.

## 2020-03-13 NOTE — ED Notes (Signed)
Pt stating she needs to get back home to her kids. Informed of order for pelvic swabs. Pt not wanting swabs at this time and states "I probably need to get back home to my kids." EDP made aware.

## 2020-03-13 NOTE — ED Provider Notes (Signed)
[facility] EMERGENCY DEPARTMENT Provider Note   CSN: [id number] Arrival date & time: 03/13/20  1026     History Chief Complaint  Patient presents with  . Back Pain    Margaret [name] Given is a 34 y.o. female.  HPI     Margaret [name] Given is a 34 y.o. female who presents to the Emergency Department complaining of gradually worsening left flank pain and left lower abdominal pain.  She states that symptoms began as mild discomfort to her right lower abdomen 4 days ago.  Pain resolved on the right then "moved" to her left flank and lower abdomen.  She describes pain as dull and constant, not affected by position change.  She woke this morning and pain to her left flank area was worse.  Symptoms have been associated with nausea, no vomiting, fever, chills, vaginal bleeding or discharge.  No new sexual partners.  Pain does not radiate into her buttock or legs.  No known injury. Had Nexplanon implant placed one year ago.     Past Medical History:  Diagnosis Date  . Vaginal Pap smear, abnormal     Patient Active Problem List   Diagnosis Date Noted  . S/P LEEP 12/23/2019  . CIN III (cervical intraepithelial neoplasia grade III) with severe dysplasia 12/07/2019  . Normal labor and delivery 07/22/2018  . Twin pregnancy with single intrauterine death, second trimester, fetus 2 02/12/2018  . Short interval between pregnancies complicating pregnancy, antepartum 12/03/2017  . Supervision of high risk pregnancy, antepartum 08/13/2016  . Abnormal Pap smear of cervix 08/13/2016  . Lump or mass in breast 11/12/2013    Past Surgical History:  Procedure Laterality Date  . LEEP N/A 12/17/2019   Procedure: LOOP ELECTROSURGICAL EXCISION PROCEDURE (LEEP);  Surgeon: Nadara Mustard, MD;  Location: [facility] ORS;  Service: Gynecology;  Laterality: N/A;  . TOOTH EXTRACTION       OB History    Gravida  4   Para  4   Term  4   Preterm      AB      Living  4     SAB      TAB      Ectopic       Multiple  0   Live Births  4        Obstetric Comments  1st Menstrual Cycle:  15 1st Pregnancy:  46  First labor was induced for postdates Second labor augmented after PROM        No family history on file.  Social History   Tobacco Use  . Smoking status: Former Smoker    Packs/day: 0.00    Years: 0.00    Pack years: 0.00    Quit date: 05/10/2014    Years since quitting: 5.8  . Smokeless tobacco: Never Used  Vaping Use  . Vaping Use: Every day  . Substances: Nicotine  Substance Use Topics  . Alcohol use: No  . Drug use: No    Home Medications Prior to Admission medications   Medication Sig Start Date End Date Taking? Authorizing Provider  etonogestrel (NEXPLANON) 68 MG IMPL implant 1 each by Subdermal route once. 01/23/19 01/22/22  [provider]    Allergies    Patient has no known allergies.  Review of Systems   Review of Systems  Constitutional: Negative for appetite change, chills and fever.  Respiratory: Negative for shortness of breath.   Cardiovascular: Negative for chest pain.  Gastrointestinal: Positive for abdominal pain  and nausea. Negative for blood in stool and vomiting.  Genitourinary: Positive for flank pain and pelvic pain. Negative for decreased urine volume, difficulty urinating, dysuria, vaginal bleeding, vaginal discharge and vaginal pain.  Musculoskeletal: Negative for back pain.  Skin: Negative for color change and rash.  Neurological: Negative for dizziness, weakness and numbness.  Hematological: Negative for adenopathy.    Physical Exam Updated Vital Signs BP 115/80 (BP Location: Right Arm)   Pulse 74   Temp 98.4 F (36.9 C) (Oral)   Resp 18   Ht 5\' 8"  (1.727 m)   Wt 69.9 kg   SpO2 100%   BMI 23.42 kg/m   Physical Exam Vitals and nursing note reviewed.  Constitutional:      Appearance: Normal appearance. She is not ill-appearing.  Cardiovascular:     Rate and Rhythm: Normal rate and regular rhythm.      Pulses: Normal pulses.  Pulmonary:     Effort: Pulmonary effort is normal.     Breath sounds: Normal breath sounds.  Abdominal:     Palpations: Abdomen is soft. There is no mass.     Tenderness: There is no abdominal tenderness. There is no right CVA tenderness or left CVA tenderness.     Comments: abd is soft, non tender on exam.    Musculoskeletal:        General: Tenderness present.     Comments: ttp of the left mid to lower lumbar paraspinal muscles.  No midline tenderness.  Negative SLR bilaterally.  Hip flexors and extensors intact.   Skin:    General: Skin is warm.     Capillary Refill: Capillary refill takes less than 2 seconds.     Findings: No rash.  Neurological:     General: No focal deficit present.     Mental Status: She is alert.     Sensory: No sensory deficit.     Motor: No weakness.     ED Results / Procedures / Treatments   Labs (all labs ordered are listed, but only abnormal results are displayed) Labs Reviewed  URINALYSIS, ROUTINE W REFLEX MICROSCOPIC - Abnormal; Notable for the following components:      Result Value   APPearance HAZY (*)    Hgb urine dipstick SMALL (*)    Bacteria, UA RARE (*)    All other components within normal limits  URINE CULTURE  PREGNANCY, URINE  COMPREHENSIVE METABOLIC PANEL  CBC WITH DIFFERENTIAL/PLATELET    EKG None  Radiology No results found.  Procedures Procedures (including critical care time)  Medications Ordered in ED Medications - No data to display  ED Course  I have reviewed the triage vital signs and the nursing notes.  Pertinent labs & imaging results that were available during my care of the patient were reviewed by me and considered in my medical decision making (see chart for details).    MDM Rules/Calculators/A&P                          Pt here with left flank/low back pain.  Reports onset of RLQ that "moved" to LLQ and left flank.  No reported dysuria, abnl vaginal bleeding or discharge.   She is well appearing, non-toxic.     CT renal stone study negative for acute findings.  Labs unremarkable. Urine cultured.  I have recommended pelvic exam, but patient declines stating that she needs to leave.  She is ambulatory and well appearing, her vitals are reassuring  and she does not appear to be in any acute distress.  My clinical suspicion for ovarian torsion is low given lack of significant pain.  Her pregnancy test is negative. She agrees to return here for pelvic US as out patient and advised to arrange close f/u with  GYN.  She agrees to return if her sx's worsen.     Final Clinical Impression(s) / ED Diagnoses Final diagnoses:  Flank pain    Rx / DC Orders ED Discharge Orders    None       Pauline Aus, PA-C 03/14/20 2336    Jacalyn Lefevre, MD 03/15/20 [phone]

## 2020-03-13 NOTE — ED Triage Notes (Signed)
Pt presents to ED with complaints of lower back pain, mainly on left side, nausea since Wednesday. Pt denies urinary symptoms, fevers.

## 2020-03-14 LAB — URINE CULTURE: Culture: >=100000 — AB

## 2020-03-15 ENCOUNTER — Telehealth: Payer: Self-pay

## 2020-04-04 ENCOUNTER — Ambulatory Visit (INDEPENDENT_AMBULATORY_CARE_PROVIDER_SITE_OTHER): Payer: Medicaid Other | Admitting: Obstetrics and Gynecology

## 2020-04-04 ENCOUNTER — Other Ambulatory Visit: Payer: Self-pay

## 2020-04-04 ENCOUNTER — Encounter: Payer: Self-pay | Admitting: Obstetrics and Gynecology

## 2020-04-04 ENCOUNTER — Ambulatory Visit (INDEPENDENT_AMBULATORY_CARE_PROVIDER_SITE_OTHER): Payer: Medicaid Other

## 2020-04-04 VITALS — BP 103/68 | HR 82 | Ht 68.0 in | Wt 156.6 lb

## 2020-04-04 DIAGNOSIS — N83201 Unspecified ovarian cyst, right side: Secondary | ICD-10-CM

## 2020-04-04 DIAGNOSIS — R102 Pelvic and perineal pain: Secondary | ICD-10-CM

## 2020-04-04 NOTE — Progress Notes (Signed)
HPI:      [name] is a 34 y.o. [id number] who LMP was Patient's last menstrual period was 03/21/2020.  Subjective:   She presents today with complaint of pelvic pain.  It started approximately 1 month ago.  It has been intermittent but seems to be worse lately.  The pain is not related to her menstrual cycle. She underwent a CT which showed an adnexal cyst likely ovarian. (4.2 cm) Patient has been taking Motrin daily and is able to function with this medication. Significant note patient has Nexplanon for birth control and has normal regular monthly menses.    Hx: The following portions of the patient's history were reviewed and updated as appropriate:             She  has a past medical history of Vaginal Pap smear, abnormal. She does not have any pertinent problems on file. She  has a past surgical history that includes Tooth extraction and LEEP (N/A, 12/17/2019). Her family history includes Healthy in her mother. She  reports that she quit smoking about 5 years ago. She smoked 0.00 packs per day for 0.00 years. She has never used smokeless tobacco. She reports that she does not drink alcohol and does not use drugs. She has a current medication list which includes the following prescription(s): nexplanon. She has No Known Allergies.       Review of Systems:  Review of Systems  Constitutional: Denied constitutional symptoms, night sweats, recent illness, fatigue, fever, insomnia and weight loss.  Eyes: Denied eye symptoms, eye pain, photophobia, vision change and visual disturbance.  Ears/Nose/Throat/Neck: Denied ear, nose, throat or neck symptoms, hearing loss, nasal discharge, sinus congestion and sore throat.  Cardiovascular: Denied cardiovascular symptoms, arrhythmia, chest pain/pressure, edema, exercise intolerance, orthopnea and palpitations.  Respiratory: Denied pulmonary symptoms, asthma, pleuritic pain, productive sputum, cough, dyspnea and wheezing.  Gastrointestinal:  Denied, gastro-esophageal reflux, melena, nausea and vomiting.  Genitourinary: See HPI for additional information.  Musculoskeletal: Denied musculoskeletal symptoms, stiffness, swelling, muscle weakness and myalgia.  Dermatologic: Denied dermatology symptoms, rash and scar.  Neurologic: Denied neurology symptoms, dizziness, headache, neck pain and syncope.  Psychiatric: Denied psychiatric symptoms, anxiety and depression.  Endocrine: Denied endocrine symptoms including hot flashes and night sweats.   Meds:   Current Outpatient Medications on File Prior to Visit  Medication Sig Dispense Refill  . etonogestrel (NEXPLANON) 68 MG IMPL implant 1 each by Subdermal route once.     No current facility-administered medications on file prior to visit.       The pregnancy intention screening data noted above was reviewed. Potential methods of contraception were discussed. The patient elected to proceed with Hormonal Implant.     Objective:     Vitals:   04/04/20 1139  BP: 103/68  Pulse: 82   Filed Weights   04/04/20 1139  Weight: 156 lb 9.6 oz (71 kg)              CT scan reviewed  Assessment:    [id number] Patient Active Problem List   Diagnosis Date Noted  . S/P LEEP 12/23/2019  . CIN III (cervical intraepithelial neoplasia grade III) with severe dysplasia 12/07/2019  . Normal labor and delivery 07/22/2018  . Twin pregnancy with single intrauterine death, second trimester, fetus 2 02/12/2018  . Short interval between pregnancies complicating pregnancy, antepartum 12/03/2017  . Supervision of high risk pregnancy, antepartum 08/13/2016  . Abnormal Pap smear of cervix 08/13/2016  . Lump or mass in breast  11/12/2013     1. Pelvic pain in female   2. Cyst of right ovary     Discussed CT scan with patient.  Simple versus complex ovarian cyst discussed.  Typical management of simple cyst discussed.  Relationship of progesterone birth control methods and ovarian cyst  discussed.  Patient's pain is currently manageable.   Plan:            1.  Recommend ultrasound for further identification of cyst and its location size characteristics.  2.  If it is a simple cyst expectant management with repeat ultrasound in 6 weeks.  If it should be complex we will contact her for further follow-up.  Orders Orders Placed This Encounter  Procedures  . US PELVIS (TRANSABDOMINAL ONLY)  . US PELVIS TRANSVAGINAL NON-OB (TV ONLY)  . US PELVIS (TRANSABDOMINAL ONLY)  . US PELVIS TRANSVAGINAL NON-OB (TV ONLY)    No orders of the defined types were placed in this encounter.     F/U  Return in about 7 weeks (around 05/26/2020). I spent 31 minutes involved in the care of this patient preparing to see the patient by obtaining and reviewing her medical history (including labs, imaging tests and prior procedures), documenting clinical information in the electronic health record (EHR), counseling and coordinating care plans, writing and sending prescriptions, ordering tests or procedures and directly communicating with the patient by discussing pertinent items from her history and physical exam as well as detailing my assessment and plan as noted above so that she has an informed understanding.  All of her questions were answered.  Elonda Husky, M.D. 04/04/2020 12:20 PM

## 2020-04-04 NOTE — Progress Notes (Signed)
Pt present due to having Ovary cyst pain. Pt c/o of lower back and abd pain x several days.

## 2020-05-17 ENCOUNTER — Other Ambulatory Visit: Payer: Medicaid Other

## 2020-12-26 ENCOUNTER — Other Ambulatory Visit: Payer: Self-pay

## 2020-12-26 ENCOUNTER — Other Ambulatory Visit (HOSPITAL_COMMUNITY)
Admission: RE | Admit: 2020-12-26 | Discharge: 2020-12-26 | Disposition: A | Discharge status: Home / Self Care | Payer: Medicaid Other | Source: Ambulatory Visit | Attending: Obstetrics and Gynecology | Admitting: Obstetrics and Gynecology

## 2020-12-26 ENCOUNTER — Ambulatory Visit (INDEPENDENT_AMBULATORY_CARE_PROVIDER_SITE_OTHER): Payer: Medicaid Other | Admitting: Obstetrics and Gynecology

## 2020-12-26 ENCOUNTER — Encounter: Payer: Self-pay | Admitting: Obstetrics and Gynecology

## 2020-12-26 VITALS — BP 100/70 | Ht 67.0 in | Wt 157.0 lb

## 2020-12-26 DIAGNOSIS — N760 Acute vaginitis: Secondary | ICD-10-CM

## 2020-12-26 DIAGNOSIS — D069 Carcinoma in situ of cervix, unspecified: Secondary | ICD-10-CM | POA: Diagnosis not present

## 2020-12-26 DIAGNOSIS — Z1151 Encounter for screening for human papillomavirus (HPV): Secondary | ICD-10-CM | POA: Insufficient documentation

## 2020-12-26 DIAGNOSIS — Z124 Encounter for screening for malignant neoplasm of cervix: Secondary | ICD-10-CM

## 2020-12-26 DIAGNOSIS — B9689 Other specified bacterial agents as the cause of diseases classified elsewhere: Secondary | ICD-10-CM

## 2020-12-26 LAB — POCT WET PREP WITH KOH
KOH Prep POC: POSITIVE — AB
Trichomonas, UA: NEGATIVE
Yeast Wet Prep HPF POC: NEGATIVE

## 2020-12-26 MED ORDER — METRONIDAZOLE 500 MG PO TABS: 500.0000 mg | ORAL_TABLET | Freq: Two times a day (BID) | ORAL | 0 refills | Status: DC

## 2020-12-26 NOTE — Progress Notes (Signed)
Margaret Croon, MD   Chief Complaint  Patient presents with   Vaginal Discharge    Abnormal odor, no itchiness x few days    HPI:      Margaret [name] is a 35 y.o. [id number] whose LMP was Patient's last menstrual period was 12/26/2020 (exact date)., presents today for increased d/c with odor, no irritation/itch for a few days. Hx of BV in past and sx are similar. No  meds to treat, no prior abx use. No LBP, pelvic pain, fevers, urin sx. She is sex active, no new partners.  Due for repeat pap. 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 Tiburcio Pea with CIN 3 on LEEP with clear margins.    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 Tiburcio Pea   Vaginal Pap smear, abnormal     Past Surgical History:  Procedure Laterality Date   LEEP N/A 12/17/2019   Procedure: LOOP ELECTROSURGICAL EXCISION PROCEDURE (LEEP);  Surgeon: Margaret Mustard, MD;  Location: [facility] ORS;  Service: Gynecology;  Laterality: N/A;   TOOTH EXTRACTION      Family History  Problem Relation Age of Onset   Healthy Mother     Social History   Socioeconomic History   Marital status: Married    Spouse name: Not on file   Number of children: Not on file   Years of education: Not on file   Highest education level: Not on file  Occupational History   Not on file  Tobacco Use   Smoking status: Former    Packs/day: 0.00    Years: 0.00    Pack years: 0.00    Types: Cigarettes    Quit date: 05/10/2014    Years since quitting: 6.6   Smokeless tobacco: Never  Vaping Use   Vaping Use: Every day   Substances: Nicotine  Substance and Sexual Activity   Alcohol use: No   Drug use: No   Sexual activity: Yes    Birth control/protection: Implant  Other Topics Concern   Not on file  Social History Narrative   Not on file   Social Determinants of Health   Financial Resource Strain: Not  on file  Food Insecurity: Not on file  Transportation Needs: Not on file  Physical Activity: Not on file  Stress: Not on file  Social Connections: Not on file  Intimate Partner Violence: Not on file    Outpatient Medications Prior to Visit  Medication Sig Dispense Refill   buPROPion (WELLBUTRIN XL) 300 MG 24 hr tablet Take 300 mg by mouth daily as needed.     etonogestrel (NEXPLANON) 68 MG IMPL implant 1 each by Subdermal route once.     LORazepam (ATIVAN) 1 MG tablet Take 1 mg by mouth daily as needed.     PROAIR HFA 108 (90 Base) MCG/ACT inhaler Inhale into the lungs.     trifluridine (VIROPTIC) 1 % ophthalmic solution SMARTSIG:In Eye(s)     valACYclovir (VALTREX) 1000 MG tablet Take 4,000 mg by mouth once.     No facility-administered medications prior to visit.      ROS:  Review of Systems  Constitutional:  Negative for fever.  Gastrointestinal:  Negative for blood in stool, constipation, diarrhea, nausea and vomiting.  Genitourinary:  Positive for vaginal discharge. Negative for dyspareunia, dysuria, flank pain, frequency, hematuria,  urgency, vaginal bleeding and vaginal pain.  Musculoskeletal:  Negative for back pain.  Skin:  Negative for rash.  BREAST: No symptoms   OBJECTIVE:   Vitals:  BP 100/70   Ht 5\' 7"  (1.702 m)   Wt 157 lb (71.2 kg)   LMP 12/26/2020 (Exact Date)   BMI 24.59 kg/m   Physical Exam Vitals reviewed.  Constitutional:      Appearance: She is well-developed.  Pulmonary:     Effort: Pulmonary effort is normal.  Genitourinary:    General: Normal vulva.     Pubic Area: No rash.      Labia:        Right: No rash, tenderness or lesion.        Left: No rash, tenderness or lesion.      Vagina: Normal. No vaginal discharge, erythema or tenderness.     Cervix: Normal.     Uterus: Normal. Not enlarged and not tender.      Adnexa: Right adnexa normal and left adnexa normal.       Right: No mass or tenderness.         Left: No mass or  tenderness.    Musculoskeletal:        General: Normal range of motion.     Cervical back: Normal range of motion.  Skin:    General: Skin is warm and dry.  Neurological:     General: No focal deficit present.     Mental Status: She is alert and oriented to person, place, and time.  Psychiatric:        Mood and Affect: Mood normal.        Behavior: Behavior normal.        Thought Content: Thought content normal.        Judgment: Judgment normal.    Results: Results for orders placed or performed in visit on 12/26/20 (from the past 24 hour(s))  POCT Wet Prep with KOH     Status: Abnormal   Collection Time: 12/26/20  3:00 PM  Result Value Ref Range   Trichomonas, UA Negative    Clue Cells Wet Prep HPF POC few    Epithelial Wet Prep HPF POC     Yeast Wet Prep HPF POC neg    Bacteria Wet Prep HPF POC     RBC Wet Prep HPF POC     WBC Wet Prep HPF POC     KOH Prep POC Positive (A) Negative     Assessment/Plan: BV (bacterial vaginosis) - Plan: metroNIDAZOLE (FLAGYL) 500 MG tablet, POCT Wet Prep with KOH; pos sx and wet prep. Rx flagyl, no EtOH. Will RF if sx recur.  Cervical cancer screening - Plan: Cytology - PAP  Screening for HPV (human papillomavirus) - Plan: Cytology - PAP  CIN III (cervical intraepithelial neoplasia grade III) with severe dysplasia - Plan: Cytology - PAP; repeat pap today, will f/u with results.     Meds ordered this encounter  Medications   metroNIDAZOLE (FLAGYL) 500 MG tablet    Sig: Take 1 tablet (500 mg total) by mouth 2 (two) times daily for 7 days.    Dispense:  14 tablet    Refill:  0    Order Specific Question:   Supervising Provider    Answer:   12/28/20 Margaret [name]      Return if symptoms worsen or fail to improve.  Margaret Romano B. Thamas Appleyard, PA-C 12/26/2020 3:01 PM

## 2020-12-30 ENCOUNTER — Encounter (INDEPENDENT_AMBULATORY_CARE_PROVIDER_SITE_OTHER): Payer: Self-pay

## 2020-12-30 LAB — CYTOLOGY - PAP
Comment: NEGATIVE
Diagnosis: NEGATIVE
High risk HPV: NEGATIVE

## 2021-01-17 ENCOUNTER — Other Ambulatory Visit: Payer: Self-pay | Admitting: Obstetrics and Gynecology

## 2021-01-17 ENCOUNTER — Encounter: Payer: Self-pay | Admitting: Obstetrics and Gynecology

## 2021-01-17 DIAGNOSIS — N760 Acute vaginitis: Secondary | ICD-10-CM

## 2021-01-17 MED ORDER — METRONIDAZOLE 500 MG PO TABS: 500.0000 mg | ORAL_TABLET | Freq: Two times a day (BID) | ORAL | 0 refills | Status: AC

## 2021-01-17 NOTE — Progress Notes (Signed)
Rx RF flagyl for recurrent BV sx 

## 2021-09-12 ENCOUNTER — Telehealth: Payer: Self-pay

## 2021-09-12 NOTE — Telephone Encounter (Signed)
Patient is scheduled for poss nexplanon replacement (01/23/19)/ irregular periods on 09/21/21 with [facility]

## 2021-09-13 NOTE — Telephone Encounter (Signed)
Noted. Will order to arrive by appointment date/time. 

## 2021-09-21 ENCOUNTER — Ambulatory Visit: Payer: Medicaid Other | Admitting: Obstetrics and Gynecology

## 2021-09-21 NOTE — Telephone Encounter (Signed)
Patient r/s to 10/03/21 per Epic. ?

## 2021-10-02 NOTE — Progress Notes (Unsigned)
PCP:  Lorelee Market, MD   No chief complaint on file.    HPI:      Margaret [name] is a 36 y.o. [id number] whose LMP was No LMP recorded. Patient has had an implant., presents today for her annual examination.  Her menses are {norm/abn:715}, lasting {number: 22536} days.  Dysmenorrhea {dysmen:716}. She {does:18564} have intermenstrual bleeding.  Sex activity: {sex active: 315163}.  Last Pap: 12/26/20  Results were: no abnormalities /neg HPV DNA . Hx of CIN3 on colpo bx 7/21 with Dr. Kenton Kingfisher; CIN 3 on LEEP with clear margins.  Hx of STDs: HPV  There is no FH of breast cancer. There is no FH of ovarian cancer. The patient {does:18564} do self-breast exams.  Tobacco use: {tob:20664} Alcohol use: {Alcohol:11675} No drug use.  Exercise: {exercise:31265}  She {does:18564} get adequate calcium and Vitamin D in her diet.  Patient Active Problem List   Diagnosis Date Noted   BV (bacterial vaginosis) 12/26/2020   S/P LEEP 12/23/2019   CIN III (cervical intraepithelial neoplasia grade III) with severe dysplasia 12/07/2019   Normal labor and delivery 07/22/2018   Twin pregnancy with single intrauterine death, second trimester, fetus 2 02/12/2018   Short interval between pregnancies complicating pregnancy, antepartum 12/03/2017   Supervision of high risk pregnancy, antepartum 08/13/2016   Abnormal Pap smear of cervix 08/13/2016   Lump or mass in breast 11/12/2013    Past Surgical History:  Procedure Laterality Date   LEEP N/A 12/17/2019   Procedure: LOOP ELECTROSURGICAL EXCISION PROCEDURE (LEEP);  Surgeon: Gae Dry, MD;  Location: [facility] ORS;  Service: Gynecology;  Laterality: N/A;   TOOTH EXTRACTION      Family History  Problem Relation Age of Onset   Healthy Mother     Social History   Socioeconomic History   Marital status: Married    Spouse name: Not on file   Number of children: Not on file   Years of education: Not on file   Highest education level: Not  on file  Occupational History   Not on file  Tobacco Use   Smoking status: Former    Packs/day: 0.00    Years: 0.00    Pack years: 0.00    Types: Cigarettes    Quit date: 05/10/2014    Years since quitting: 7.4   Smokeless tobacco: Never  Vaping Use   Vaping Use: Every day   Substances: Nicotine  Substance and Sexual Activity   Alcohol use: No   Drug use: No   Sexual activity: Yes    Birth control/protection: Implant  Other Topics Concern   Not on file  Social History Narrative   Not on file   Social Determinants of Health   Financial Resource Strain: Not on file  Food Insecurity: Not on file  Transportation Needs: Not on file  Physical Activity: Not on file  Stress: Not on file  Social Connections: Not on file  Intimate Partner Violence: Not on file     Current Outpatient Medications:    buPROPion (WELLBUTRIN XL) 300 MG 24 hr tablet, Take 300 mg by mouth daily as needed., Disp: , Rfl:    etonogestrel (NEXPLANON) 68 MG IMPL implant, 1 each by Subdermal route once., Disp: , Rfl:    LORazepam (ATIVAN) 1 MG tablet, Take 1 mg by mouth daily as needed., Disp: , Rfl:    PROAIR HFA 108 (90 Base) MCG/ACT inhaler, Inhale into the lungs., Disp: , Rfl:    trifluridine (VIROPTIC) 1 %  ophthalmic solution, SMARTSIG:In Eye(s), Disp: , Rfl:    valACYclovir (VALTREX) 1000 MG tablet, Take 4,000 mg by mouth once., Disp: , Rfl:      ROS:  Review of Systems BREAST: No symptoms   Objective: There were no vitals taken for this visit.   OBGyn Exam  Results: No results found for this or any previous visit (from the past 24 hour(s)).  Assessment/Plan: No diagnosis found.  No orders of the defined types were placed in this encounter.            GYN counsel {counseling: 16159}     F/U  No follow-ups on file.  Kameran Mcneese B. Taliya Mcclard, PA-C 10/02/2021 11:17 AM

## 2021-10-03 ENCOUNTER — Encounter: Payer: Self-pay | Admitting: Obstetrics and Gynecology

## 2021-10-03 ENCOUNTER — Other Ambulatory Visit (HOSPITAL_COMMUNITY)
Admission: RE | Admit: 2021-10-03 | Discharge: 2021-10-03 | Disposition: A | Discharge status: Home / Self Care | Payer: Medicaid Other | Source: Ambulatory Visit | Attending: Obstetrics and Gynecology | Admitting: Obstetrics and Gynecology

## 2021-10-03 ENCOUNTER — Ambulatory Visit: Payer: Medicaid Other | Admitting: Obstetrics and Gynecology

## 2021-10-03 VITALS — BP 90/64 | Ht 67.0 in | Wt 152.0 lb

## 2021-10-03 DIAGNOSIS — Z01419 Encounter for gynecological examination (general) (routine) without abnormal findings: Secondary | ICD-10-CM

## 2021-10-03 DIAGNOSIS — Z975 Presence of (intrauterine) contraceptive device: Secondary | ICD-10-CM

## 2021-10-03 DIAGNOSIS — Z124 Encounter for screening for malignant neoplasm of cervix: Secondary | ICD-10-CM

## 2021-10-03 DIAGNOSIS — D069 Carcinoma in situ of cervix, unspecified: Secondary | ICD-10-CM | POA: Diagnosis not present

## 2021-10-03 DIAGNOSIS — Z1151 Encounter for screening for human papillomavirus (HPV): Secondary | ICD-10-CM | POA: Insufficient documentation

## 2021-10-03 DIAGNOSIS — N921 Excessive and frequent menstruation with irregular cycle: Secondary | ICD-10-CM

## 2021-10-04 LAB — CYTOLOGY - PAP
Comment: NEGATIVE
Diagnosis: UNDETERMINED — AB
High risk HPV: NEGATIVE

## 2021-10-05 ENCOUNTER — Encounter: Payer: Self-pay | Admitting: Obstetrics and Gynecology

## 2021-10-05 NOTE — Telephone Encounter (Signed)
I sent pt results msg on pap smear. After she has seen it, pls call her to make sure she doesn't have any further questions. Thx.

## 2021-12-29 ENCOUNTER — Ambulatory Visit
Admission: RE | Admit: 2021-12-29 | Discharge: 2021-12-29 | Disposition: A | Discharge status: Home / Self Care | Payer: Medicaid Other | Source: Ambulatory Visit | Attending: Family Medicine | Admitting: Family Medicine

## 2021-12-29 VITALS — BP 105/67 | HR 88 | Temp 98.6°F | Resp 16

## 2021-12-29 DIAGNOSIS — A084 Viral intestinal infection, unspecified: Secondary | ICD-10-CM | POA: Insufficient documentation

## 2021-12-29 DIAGNOSIS — N390 Urinary tract infection, site not specified: Secondary | ICD-10-CM | POA: Insufficient documentation

## 2021-12-29 DIAGNOSIS — R110 Nausea: Secondary | ICD-10-CM | POA: Diagnosis not present

## 2021-12-29 LAB — POCT URINALYSIS DIP (MANUAL ENTRY)
Bilirubin, UA: NEGATIVE
Blood, UA: NEGATIVE
Glucose, UA: 100 mg/dL — AB
Ketones, POC UA: NEGATIVE mg/dL
Leukocytes, UA: NEGATIVE
Nitrite, UA: POSITIVE — AB
Protein Ur, POC: 30 mg/dL — AB
Spec Grav, UA: 1.02 (ref 1.010–1.025)
Urobilinogen, UA: 2 E.U./dL — AB
pH, UA: 7 (ref 5.0–8.0)

## 2021-12-29 LAB — POCT URINE PREGNANCY: Preg Test, Ur: NEGATIVE

## 2021-12-29 MED ORDER — ONDANSETRON HCL 4 MG PO TABS: 4.0000 mg | ORAL_TABLET | Freq: Four times a day (QID) | ORAL | 0 refills | Status: DC

## 2021-12-29 MED ORDER — NITROFURANTOIN MONOHYD MACRO 100 MG PO CAPS: 100.0000 mg | ORAL_CAPSULE | Freq: Two times a day (BID) | ORAL | 0 refills | Status: AC

## 2021-12-29 NOTE — Discharge Instructions (Addendum)
Your urine culture will result within 3-4 days.  We will notify you if there is any changes in treatment based on urine culture results.  If you do not hear anything from our clinic continue with current treatment as prescribed.  Hydrate well with fluids.  If you develop any fever or  severe abdominal pain go immediately to the emergency department.   Follow-up with PCP regarding chronic urge to urinate.   Recommend imodium AD (over the counter) for diarrhea.

## 2021-12-29 NOTE — ED Triage Notes (Addendum)
Reports "bladder problems" where every time she drank she had to pee over a week ago. Sunday began having diarrhea. Nausea, back pain (lower bilateral) began yesterday. Denies fever, vomiting, dysuria, hematuria/hematochezia. Tried Azo yesterday without improvement of symptoms. Reports generalized abdominal pain starting last night after eating. Denies possibility of pregnancy or STD. Reports hx of kidney infections in the past. States she continues to have loose bowel movements that are yellowish in color.

## 2021-12-29 NOTE — ED Provider Notes (Signed)
Margaret [name]    CSN: [id number] Arrival date & time: 12/29/21  1531      History   Chief Complaint Chief Complaint  Patient presents with   Back Pain    Lower back pain,nausea,stomach pain frequent urinating with bladder pain if I don't urinate right away - Entered by patient   Appointment    HPI Margaret [name] is a 36 y.o. female.   HPI Margaret [name] is a 36 y.o. female presents for evaluation of urinary frequency, dysuria, low back pain, generalized abdominal pain x one week. She is without flank pain , hematuria without flank pain, fever, chills, or abnormal vaginal discharge or bleeding. She has developed nausea and low back pain since last night. Loose diarrhea x 5 days. Patient has an implant.  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 Tiburcio Pea   Vaginal Pap smear, abnormal     Patient Active Problem List   Diagnosis Date Noted   BV (bacterial vaginosis) 12/26/2020   S/P LEEP 12/23/2019   CIN III (cervical intraepithelial neoplasia grade III) with severe dysplasia 12/07/2019   Normal labor and delivery 07/22/2018   Twin pregnancy with single intrauterine death, second trimester, fetus 2 02/12/2018   Short interval between pregnancies complicating pregnancy, antepartum 12/03/2017   Supervision of high risk pregnancy, antepartum 08/13/2016   Abnormal Pap smear of cervix 08/13/2016   Lump or mass in breast 11/12/2013    Past Surgical History:  Procedure Laterality Date   LEEP N/A 12/17/2019   Procedure: LOOP ELECTROSURGICAL EXCISION PROCEDURE (LEEP);  Surgeon: Nadara Mustard, MD;  Location: [facility];  Service: Gynecology;  Laterality: N/A;   TOOTH EXTRACTION      OB History     Gravida  4   Para  4   Term  4   Preterm      AB      Living  4      SAB      IAB      Ectopic      Multiple  0   Live Births   4        Obstetric Comments  1st Menstrual Cycle:  15 1st Pregnancy:  89  First labor was induced for postdates Second labor augmented after PROM          Home Medications    Prior to Admission medications   Medication Sig Start Date End Date Taking? Authorizing Provider  nitrofurantoin, macrocrystal-monohydrate, (MACROBID) 100 MG capsule Take 1 capsule (100 mg total) by mouth 2 (two) times daily for 5 days. 12/29/21 01/03/22 Yes Bing Neighbors, FNP  ondansetron (ZOFRAN) 4 MG tablet Take 1 tablet (4 mg total) by mouth every 6 (six) hours. 12/29/21  Yes Bing Neighbors, FNP  etonogestrel (NEXPLANON) 68 MG IMPL implant 1 each by Subdermal route once. 01/23/19 01/22/22  [provider]  LORazepam (ATIVAN) 1 MG tablet Take 1 mg by mouth daily as needed. 12/05/20   [provider]    Family History Family History  Problem Relation Age of Onset   Healthy Mother     Social History Social History   Tobacco Use   Smoking status: Former    Packs/day: 0.00    Years: 0.00    Total pack years: 0.00    Types: Cigarettes    Quit  date: 05/10/2014    Years since quitting: 7.6   Smokeless tobacco: Never  Vaping Use   Vaping Use: Every day   Substances: Nicotine  Substance Use Topics   Alcohol use: No   Drug use: No     Allergies   Patient has no known allergies.   Review of Systems Review of Systems Pertinent negatives listed in HPI   Physical Exam Triage Vital Signs ED Triage Vitals  Enc Vitals Group     BP 12/29/21 1545 105/67     Pulse Rate 12/29/21 1545 88     Resp 12/29/21 1545 16     Temp 12/29/21 1545 98.6 F (37 C)     Temp Source 12/29/21 1545 Oral     SpO2 12/29/21 1545 97 %     Weight --      Height --      Head Circumference --      Peak Flow --      Pain Score 12/29/21 1549 4     Pain Loc --      Pain Edu? --      Excl. in GC? --    No data found.  Updated Vital Signs BP 105/67 (BP Location: Left Arm)   Pulse 88    Temp 98.6 F (37 C) (Oral)   Resp 16   SpO2 97%   Visual Acuity Right Eye Distance:   Left Eye Distance:   Bilateral Distance:    Right Eye Near:   Left Eye Near:    Bilateral Near:     Physical Exam General appearance: Alert, well developed, non-toxic appearing, cooperative  Head: Normocephalic, without obvious abnormality, atraumatic Heart: Rate and rhythm normal.  No murmur  Respiratory: Respirations even and unlabored, normal respiratory rate Abdominal: BS +, no tenderness x 4 quad, no CVA tenderness  CVA: No flank pain Skin: Skin color, texture, turgor normal. No rashes seen  Psych: Appropriate mood and affect.   UC Treatments / Results  Labs (all labs ordered are listed, but only abnormal results are displayed) Labs Reviewed  POCT URINALYSIS DIP (MANUAL ENTRY) - Abnormal; Notable for the following components:      Result Value   Color, UA orange (*)    Glucose, UA =100 (*)    Protein Ur, POC =30 (*)    Urobilinogen, UA 2.0 (*)    Nitrite, UA Positive (*)    All other components within normal limits  URINE CULTURE  POCT URINE PREGNANCY    EKG   Radiology No results found.  Procedures Procedures (including critical care time)  Medications Ordered in UC Medications - No data to display  Initial Impression / Assessment and Plan / UC Course  I have reviewed the triage vital signs and the nursing notes.  Pertinent labs & imaging results that were available during my care of the patient were reviewed by me and considered in my medical decision making (see chart for details).      -Viral gastroenterologist, symptom management. Zofran for nausea. UA abnormal and findings consistent with UTI. Empiric antibiotic treatment initiated.  Encouraged increase intake of water.  Urine culture pending. ER if symptoms become severe. Follow-up with PCP if symptoms do not completely resolve.  Final Clinical Impressions(s) / UC Diagnoses   Final diagnoses:  Acute UTI  (urinary tract infection)  Viral gastroenteritis  Nausea without vomiting     Discharge Instructions      Your urine culture will result within 3-4 days.  We  will notify you if there is any changes in treatment based on urine culture results.  If you do not hear anything from our clinic continue with current treatment as prescribed.  Hydrate well with fluids.  If you develop any fever or  severe abdominal pain go immediately to the emergency department.   Follow-up with PCP regarding chronic urge to urinate.   Recommend imodium AD (over the counter) for diarrhea.      ED Prescriptions     Medication Sig Dispense Auth. Provider   ondansetron (ZOFRAN) 4 MG tablet Take 1 tablet (4 mg total) by mouth every 6 (six) hours. 12 tablet Bing Neighbors, FNP   nitrofurantoin, macrocrystal-monohydrate, (MACROBID) 100 MG capsule Take 1 capsule (100 mg total) by mouth 2 (two) times daily for 5 days. 10 capsule Bing Neighbors, FNP      PDMP not reviewed this encounter.   Bing Neighbors, FNP 12/29/21 1615

## 2021-12-30 LAB — URINE CULTURE
Culture: NO GROWTH
Special Requests: NORMAL

## 2022-01-08 ENCOUNTER — Telehealth: Payer: Self-pay | Admitting: Obstetrics and Gynecology

## 2022-01-08 NOTE — Telephone Encounter (Signed)
PT is scheduled with [facility] on 01-25-2022 for removal and placement of nexplanon.

## 2022-01-10 NOTE — Telephone Encounter (Signed)
Noted. Will order to arrive by appointment date/time. 

## 2022-01-24 NOTE — Progress Notes (Signed)
   Chief Complaint  Patient presents with   Contraception    Nexplanon replacement     HPI:  Margaret [name] is a 36 y.o. [phone] here for Nexplanon removal and insertion. Nexplanon placed 3 yrs ago, having monthly menses, lasting 5-7 days, mod flow, no dysmen. Has occas BTB. Sx from 5/23 improved.  ASCUS/ neg HPV DNA on 5/23 pap. Hx of CIN 3 with LEEP/ repeat due in 1 yr.    BP 90/60   Ht 5\' 6"  (1.676 m)   Wt 154 lb (69.9 kg)   BMI 24.86 kg/m    Nexplanon removal Procedure note - The Nexplanon was noted in the patient's arm and the end was identified. The skin was cleansed with a Betadine solution. A small injection of subcutaneous lidocaine with epinephrine was given over the end of the implant. An incision was made at the end of the implant. The rod was noted in the incision and grasped with a hemostat. It was noted to be intact.  Steri-Strip was placed approximating the incision. Hemostasis was noted.  Nexplanon Insertion  Patient given informed consent, signed copy in the chart, time out was performed. Pregnancy test was neg Appropriate time out taken.  Patient's LEFT arm was prepped and draped in the usual sterile fashion. The ruler used to measure and mark insertion area.  Pt was prepped with betadine swab and then injected with 1.0 cc of 2% lidocaine with epinephrine. Nexplanon removed form packaging,  Device confirmed in needle, then inserted full length of needle and withdrawn per handbook instructions.  Pt insertion site covered with steri-strip and a bandage.   Minimal blood loss.  Pt tolerated the procedure well.  Results for orders placed or performed in visit on 01/25/22 (from the past 24 hour(s))  POCT urine pregnancy     Status: Normal   Collection Time: 01/25/22  9:32 AM  Result Value Ref Range   Preg Test, Ur Negative Negative     Assessment: Encounter for removal and reinsertion of Nexplanon - Plan: POCT urine pregnancy, etonogestrel (NEXPLANON) implant 68  mg   Meds ordered this encounter  Medications   etonogestrel (NEXPLANON) implant 68 mg     Plan:   She was told to remove the dressing in 12-24 hours, to keep the incision area dry for 24 hours and to remove the Steristrip in 2-3  days.  Notify [birth year]1992 if any signs of tenderness, redness, pain, or fevers develop.   Declan Adamson B. Ocie Tino, PA-C 01/25/2022 9:45 AM

## 2022-01-25 ENCOUNTER — Encounter: Payer: Self-pay | Admitting: Obstetrics and Gynecology

## 2022-01-25 ENCOUNTER — Ambulatory Visit (INDEPENDENT_AMBULATORY_CARE_PROVIDER_SITE_OTHER): Payer: Medicaid Other | Admitting: Obstetrics and Gynecology

## 2022-01-25 VITALS — BP 90/60 | Ht 66.0 in | Wt 154.0 lb

## 2022-01-25 DIAGNOSIS — Z3046 Encounter for surveillance of implantable subdermal contraceptive: Secondary | ICD-10-CM | POA: Diagnosis not present

## 2022-01-25 LAB — POCT URINE PREGNANCY: Preg Test, Ur: NEGATIVE

## 2022-01-25 MED ORDER — ETONOGESTREL 68 MG ~~LOC~~ IMPL
68.0000 mg | DRUG_IMPLANT | Freq: Once | SUBCUTANEOUS | Status: AC
  Administered 2022-01-25: 68 mg via SUBCUTANEOUS

## 2022-01-25 NOTE — Patient Instructions (Addendum)
I value your feedback and you entrusting us with your care. If you get a [facility] patient survey, I would appreciate you taking the time to let us know about your experience today. Thank you!  Remove the dressing in 24 hours,  keep the incision area dry for 24 hours and remove the Steristrip in 2-3  days.  Notify us if any signs of tenderness, redness, pain, or fevers develop.   

## 2022-05-05 ENCOUNTER — Ambulatory Visit
Admission: RE | Admit: 2022-05-05 | Discharge: 2022-05-05 | Disposition: A | Discharge status: Home / Self Care | Payer: Medicaid Other | Source: Ambulatory Visit | Attending: Urgent Care | Admitting: Urgent Care

## 2022-05-05 VITALS — BP 97/61 | HR 107 | Temp 98.2°F | Resp 17

## 2022-05-05 DIAGNOSIS — R6889 Other general symptoms and signs: Secondary | ICD-10-CM | POA: Diagnosis not present

## 2022-05-05 DIAGNOSIS — J029 Acute pharyngitis, unspecified: Secondary | ICD-10-CM

## 2022-05-05 LAB — POCT RAPID STREP A (OFFICE): Rapid Strep A Screen: NEGATIVE

## 2022-05-05 MED ORDER — OSELTAMIVIR PHOSPHATE 75 MG PO CAPS: 75.0000 mg | ORAL_CAPSULE | Freq: Two times a day (BID) | ORAL | 0 refills | Status: DC

## 2022-05-05 NOTE — ED Provider Notes (Signed)
[facility]    CSN: [id number] Arrival date & time: 05/05/22  1040      History   Chief Complaint Chief Complaint  Patient presents with   Sore Throat    Body aches and sore throat - Entered by patient   Generalized Body Aches   Hoarse    HPI Margaret [name] is a 36 y.o. female.    Sore Throat    Patient presents to urgent care with complaint of sore throat, body aches, chills, hoarse voice x 2 days.  She endorses no documented fever.  No nausea, vomiting, diarrhea.  She is accompanied by her 84-year-old daughter who is also presenting with upper respiratory symptoms.  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 Tiburcio Pea   Vaginal Pap smear, abnormal     Patient Active Problem List   Diagnosis Date Noted   BV (bacterial vaginosis) 12/26/2020   S/P LEEP 12/23/2019   CIN III (cervical intraepithelial neoplasia grade III) with severe dysplasia 12/07/2019   Normal labor and delivery 07/22/2018   Twin pregnancy with single intrauterine death, second trimester, fetus 2 02/12/2018   Short interval between pregnancies complicating pregnancy, antepartum 12/03/2017   Supervision of high risk pregnancy, antepartum 08/13/2016   Abnormal Pap smear of cervix 08/13/2016   Lump or mass in breast 11/12/2013    Past Surgical History:  Procedure Laterality Date   LEEP N/A 12/17/2019   Procedure: LOOP ELECTROSURGICAL EXCISION PROCEDURE (LEEP);  Surgeon: Nadara Mustard, MD;  Location: [facility];  Service: Gynecology;  Laterality: N/A;   TOOTH EXTRACTION      OB History     Gravida  4   Para  4   Term  4   Preterm      AB      Living  4      SAB      IAB      Ectopic      Multiple  0   Live Births  4        Obstetric Comments  1st Menstrual Cycle:  15 1st Pregnancy:  73  First labor was induced for postdates Second  labor augmented after PROM          Home Medications    Prior to Admission medications   Medication Sig Start Date End Date Taking? Authorizing Provider  etonogestrel (NEXPLANON) 68 MG IMPL implant 1 each by Subdermal route once. 01/25/22 01/25/25  [provider]  LORazepam (ATIVAN) 1 MG tablet Take 1 mg by mouth daily as needed. 12/05/20   [provider]    Family History Family History  Problem Relation Age of Onset   Healthy Mother     Social History Social History   Tobacco Use   Smoking status: Former    Packs/day: 0.00    Years: 0.00    Total pack years: 0.00    Types: Cigarettes    Quit date: 05/10/2014    Years since quitting: 7.9   Smokeless tobacco: Never  Vaping Use   Vaping Use: Every day   Substances: Nicotine  Substance Use Topics   Alcohol use: No   Drug use: No     Allergies   Patient has no known allergies.   Review of Systems Review of Systems   Physical Exam Triage Vital Signs ED Triage Vitals  Enc Vitals Group     BP 05/05/22 1112 97/61     Pulse Rate 05/05/22 1112 (!) 107     Resp 05/05/22 1112 17     Temp 05/05/22 1112 98.2 F (36.8 C)     Temp src --      SpO2 05/05/22 1112 99 %     Weight --      Height --      Head Circumference --      Peak Flow --      Pain Score 05/05/22 1113 0     Pain Loc --      Pain Edu? --      Excl. in GC? --    No data found.  Updated Vital Signs BP 97/61   Pulse (!) 107   Temp 98.2 F (36.8 C)   Resp 17   LMP 04/13/2022 (Approximate)   SpO2 99%   Visual Acuity Right Eye Distance:   Left Eye Distance:   Bilateral Distance:    Right Eye Near:   Left Eye Near:    Bilateral Near:     Physical Exam HENT:     Mouth/Throat:     Pharynx: Posterior oropharyngeal erythema present. No oropharyngeal exudate.  Cardiovascular:     Rate and Rhythm: Normal rate and regular rhythm.     Heart sounds: Normal heart sounds.  Pulmonary:     Effort: Pulmonary effort is  normal.     Breath sounds: Normal breath sounds.  Skin:    General: Skin is warm and dry.  Neurological:     General: No focal deficit present.     Mental Status: She is alert and oriented to person, place, and time.  Psychiatric:        Mood and Affect: Mood normal.        Behavior: Behavior normal.      UC Treatments / Results  Labs (all labs ordered are listed, but only abnormal results are displayed) Labs Reviewed - No data to display  EKG   Radiology No results found.  Procedures Procedures (including critical care time)  Medications Ordered in UC Medications - No data to display  Initial Impression / Assessment and Plan / UC Course  I have reviewed the triage vital signs and the nursing notes.  Pertinent labs & imaging results that were available during my care of the patient were reviewed by me and considered in my medical decision making (see chart for details).   Patient is afebrile here with recent antipyretics (acetaminophen and DayQuil). Satting well on room air. Overall is ill appearing, well hydrated, without respiratory distress. Pulmonary exam is unremarkable.  Lungs CTAB without wheezing, rhonchi, rales.  Rapid strep is negative.  Symptoms are consistent with upper respiratory viral infection including influenza.  We will treat today presumptively for influenza with Tamiflu.  Continue to recommend use of OTC medication for symptom control including ibuprofen for body aches and sore throat.  Final Clinical Impressions(s) / UC Diagnoses   Final diagnoses:  None   Discharge Instructions   None    ED Prescriptions   None    PDMP not reviewed this encounter.   Charma Igo, [place] 05/05/22 1129

## 2022-05-05 NOTE — Discharge Instructions (Signed)
You have been diagnosed with a viral upper respiratory infection based on your symptoms and exam. Viral illnesses cannot be treated with antibiotics - they are self limiting - and you should find your symptoms resolving within a few days. Get plenty of rest and non-caffeinated fluids. Watch for signs of dehydration including reduced urine output and dark colored urine.  You tested negative for a strep bacterial infection.  I have prescribed Tamiflu, antiviral therapy for influenza A, based on a presumptive diagnosis of influenza.    We recommend you use over-the-counter medications for symptom control including Tylenol or ibuprofen for fever, chills or body aches, and cold/cough medication.  Saline mist spray is helpful for removing excess mucus from your nose.  Room humidifiers are helpful to ease breathing at night. I recommend guaifenesin (Mucinex) to help thin and loosen mucus secretions in your respiratory passages.   If appropriate based upon your other medical problems, you might also find relief of nasal/sinus congestion symptoms by using a nasal decongestant such as Flonase (fluticasone) or Sudafed sinus (pseudoephedrine).  You will need to obtain Sudafed from behind the pharmacist counter.  Speak to the pharmacist to verify that you are not duplicating medications with other over-the-counter formulations that you may be using.   Follow up here or with your primary care provider if your symptoms are worsening or not improving.

## 2022-05-05 NOTE — ED Triage Notes (Signed)
Pt. Presents to UC w/ c/o a sore throat, body aches and being hoarse for the past 2 days.

## 2022-09-12 NOTE — Telephone Encounter (Signed)
Error

## 2022-11-23 ENCOUNTER — Encounter: Payer: Self-pay | Admitting: *Deleted

## 2022-11-26 ENCOUNTER — Ambulatory Visit (INDEPENDENT_AMBULATORY_CARE_PROVIDER_SITE_OTHER): Payer: Medicaid Other

## 2022-11-26 ENCOUNTER — Encounter: Payer: Self-pay | Admitting: Cardiovascular Disease

## 2022-11-26 ENCOUNTER — Ambulatory Visit: Payer: Medicaid Other | Attending: Cardiovascular Disease | Admitting: Cardiovascular Disease

## 2022-11-26 VITALS — BP 106/64 | HR 76 | Ht 68.0 in | Wt 157.5 lb

## 2022-11-26 DIAGNOSIS — R002 Palpitations: Secondary | ICD-10-CM

## 2022-11-26 NOTE — Progress Notes (Signed)
Cardiology Office Note  Date:  11/26/2022   ID:  Margaret [name], DOB 04-Jun-1985, MRN [id number]  PCP:  Alease Medina, MD   Chief Complaint  Patient presents with   New Patient (Initial Visit)    Ref by Dr. Girtha Rm for palpitations. Medications reviewed by the patient verbally. Patient c/o chest heaviness, shortness of breath and palpitations.     HPI:  [name] is a 37 year old woman with past medical history of Anxiety/depression Who presents by referral from Dr. Dian Situ for palpitations  On discussion today she reports that for the past several years she has appreciated periodic palpitations Typically once a week lasting for 5 to 10 seconds would appreciate fluttering Feels that is slow, not rapid Denies near syncope, orthostasis symptoms when having palpitations  Mother of 43, age up to 60  Denies significant chest pain or shortness of breath on exertion Active at baseline  Family history Father with smoking history, on oxygen, reported to have cardiac issues  EKG personally reviewed by myself on todays visit EKG Interpretation Date/Time:  Monday November 26 2022 15:07:11 EDT Ventricular Rate:  76 PR Interval:  120 QRS Duration:  80 QT Interval:  388 QTC Calculation: 436 R Axis:   78  Text Interpretation: Normal sinus rhythm with sinus arrhythmia Normal ECG No previous ECGs available Confirmed by Julien Nordmann [phone]) on 11/26/2022 3:11:55 PM    PMH:   has a past medical history of Anxiety, Depression, High grade squamous intraepithelial lesion (HGSIL), grade 3 CIN, on biopsy of cervix (11/2019), and Vaginal Pap smear, abnormal.  PSH:    Past Surgical History:  Procedure Laterality Date   LEEP N/A 12/17/2019   Procedure: LOOP ELECTROSURGICAL EXCISION PROCEDURE (LEEP);  Surgeon: Nadara Mustard, MD;  Location: [facility] ORS;  Service: Gynecology;  Laterality: N/A;   TOOTH EXTRACTION      Current Outpatient Medications  Medication Sig Dispense Refill    etonogestrel (NEXPLANON) 68 MG IMPL implant 1 each by Subdermal route once.     LORazepam (ATIVAN) 1 MG tablet Take 1 mg by mouth daily as needed. (Patient not taking: Reported on 11/26/2022)     No current facility-administered medications for this visit.     Allergies:   Patient has no known allergies.   Social History:  The patient  reports that she quit smoking about 8 years ago. Her smoking use included cigarettes. She started smoking about 15 years ago. She has a 7 pack-year smoking history. She has never used smokeless tobacco. She reports that she does not drink alcohol and does not use drugs.   Family History:   family history includes Healthy in her mother.    Review of Systems: Review of Systems  Constitutional: Negative.   HENT: Negative.    Respiratory: Negative.    Cardiovascular:  Positive for palpitations.  Gastrointestinal: Negative.   Musculoskeletal: Negative.   Neurological: Negative.   Psychiatric/Behavioral: Negative.    All other systems reviewed and are negative.    PHYSICAL EXAM: VS:  BP 106/64 (BP Location: Right Arm, Patient Position: Sitting, Cuff Size: Normal)   Pulse 76   Ht 5\' 8"  (1.727 m)   Wt 157 lb 8 oz (71.4 kg)   SpO2 99%   BMI 23.95 kg/m  , BMI Body mass index is 23.95 kg/m. GEN: Well nourished, well developed, in no acute distress HEENT: normal Neck: no JVD, carotid bruits, or masses Cardiac: RRR; no murmurs, rubs, or gallops,no edema  Respiratory:  clear to  auscultation bilaterally, normal work of breathing GI: soft, nontender, nondistended, + BS MS: no deformity or atrophy Skin: warm and dry, no rash Neuro:  Strength and sensation are intact Psych: euthymic mood, full affect   Recent Labs: No results found for requested labs within last 365 days.    Lipid Panel No results found for: "CHOL", "HDL", "LDLCALC", "TRIG"    Wt Readings from Last 3 Encounters:  11/26/22 157 lb 8 oz (71.4 kg)  01/25/22 154 lb (69.9 kg)   10/03/21 152 lb (68.9 kg)       ASSESSMENT AND PLAN:  Problem List Items Addressed This Visit   None Visit Diagnoses     Palpitations    -  Primary   Relevant Orders   EKG 12-Lead (Completed)      Palpitations Once a week lasting 5 to 10 seconds, abnormal rhythm that feels slow No associated near-syncope, syncope, orthostasis Etiology unclear, unable to exclude PVCs, APCs, even atrial tach/SVT Arrhythmia details discussed Recommended Zio monitor for further evaluation No strong indication for echocardiogram given normal clinical exam and EKG on today's visit, normal exercise tolerance    Total encounter time more than 50 minutes  Greater than 50% was spent in counseling and coordination of care with the patient    Signed, Dossie Arbour, M.D., Ph.D. [facility] [place], [place] [phone]

## 2022-11-26 NOTE — Patient Instructions (Addendum)
Medication Instructions:  No changes  If you need a refill on your cardiac medications before your next appointment, please call your pharmacy.   Lab work: No new labs needed  Testing/Procedures:  ZIO XT- Long Term Monitor Instructions  Your physician has requested you wear a ZIO patch monitor for 14 days.  This is a single patch monitor. [employer] supplies one patch monitor per enrollment. Additional stickers are not available. Please do not apply patch if you will be having a Nuclear Stress Test,  Echocardiogram, Cardiac CT, MRI, or Chest Xray during the period you would be wearing the  monitor. The patch cannot be worn during these tests. You cannot remove and re-apply the  ZIO XT patch monitor.  Your ZIO patch monitor will be mailed 3 day USPS to your address on file. It may take 3-5 days  to receive your monitor after you have been enrolled.  Once you have received your monitor, please review the enclosed instructions. Your monitor  has already been registered assigning a specific monitor serial # to you.  Billing and Patient Assistance Program Information  We have supplied [employer] with any of your insurance information on file for billing purposes. [employer] offers a sliding scale Patient Assistance Program for patients that do not have  insurance, or whose insurance does not completely cover the cost of the ZIO monitor.  You must apply for the Patient Assistance Program to qualify for this discounted rate.  To apply, please call [employer] at [phone], select option 4, select option 2, ask to apply for  Patient Assistance Program. Meredeth Ide will ask your household income, and how many people  are in your household. They will quote your out-of-pocket cost based on that information.  [employer] will also be able to set up a 31-month, interest-free payment plan if needed.  Applying the monitor   Shave hair from upper left chest.  Hold abrader disc by orange tab. Rub abrader in 40  strokes over the upper left chest as  indicated in your monitor instructions.  Clean area with 4 enclosed alcohol pads. Let dry.  Apply patch as indicated in monitor instructions. Patch will be placed under collarbone on left  side of chest with arrow pointing upward.  Rub patch adhesive wings for 2 minutes. Remove white label marked "1". Remove the white  label marked "2". Rub patch adhesive wings for 2 additional minutes.  While looking in a mirror, press and release button in center of patch. A small green light will  flash 3-4 times. This will be your only indicator that the monitor has been turned on.  Do not shower for the first 24 hours. You may shower after the first 24 hours.  Press the button if you feel a symptom. You will hear a small click. Record Date, Time and  Symptom in the Patient Logbook.  When you are ready to remove the patch, follow instructions on the last 2 pages of Patient  Logbook. Stick patch monitor onto the last page of Patient Logbook.  Place Patient Logbook in the blue and white box. Use locking tab on box and tape box closed  securely. The blue and white box has prepaid postage on it. Please place it in the mailbox as  soon as possible. Your physician should have your test results approximately 7 days after the  monitor has been mailed back to [facility].  Call [facility] Customer Care at [phone] if you have questions regarding  your ZIO XT patch  monitor. Call them immediately if you see an orange light blinking on your  monitor.  If your monitor falls off in less than 4 days, contact our Monitor department at [phone].  If your monitor becomes loose or falls off after 4 days call [employer] at [phone] for  suggestions on securing your monitor   Follow-Up: At [facility], you and your health needs are our priority.  As part of our continuing mission to provide you with exceptional heart care, we have created designated Provider Care  Teams.  These Care Teams include your primary Cardiologist (physician) and Advanced Practice Providers (APPs -  Physician Assistants and Nurse Practitioners) who all work together to provide you with the care you need, when you need it.  You will need a follow up appointment as needed  Providers on your designated Care Team:   Murray Hodgkins, NP Christell Faith, PA-C Cadence Kathlen Mody, [place]  COVID-19 Vaccine Information can be found at: [email or web address] For questions related to vaccine distribution or appointments, please email vaccine@ .com or call [phone].

## 2022-11-28 DIAGNOSIS — R002 Palpitations: Secondary | ICD-10-CM

## 2022-12-27 ENCOUNTER — Telehealth: Payer: Self-pay | Admitting: Cardiovascular Disease

## 2022-12-27 NOTE — Telephone Encounter (Signed)
Called patient, advised of message below, advised that we did not have recommendations from the provider at this time and we would call with results when received. Patient verbalized understanding

## 2022-12-27 NOTE — Telephone Encounter (Signed)
Pt calling to f/u on Heart Monitor results. Please advise 

## 2023-01-01 NOTE — Telephone Encounter (Signed)
Patient viewed results in MyChart. 

## 2023-01-12 ENCOUNTER — Ambulatory Visit: Payer: Self-pay

## 2023-02-13 ENCOUNTER — Ambulatory Visit
Admission: RE | Admit: 2023-02-13 | Discharge: 2023-02-13 | Disposition: A | Discharge status: Home / Self Care | Payer: Medicaid Other | Source: Ambulatory Visit | Attending: Emergency Medicine | Admitting: Emergency Medicine

## 2023-02-13 VITALS — BP 121/76 | HR 87 | Temp 97.8°F | Resp 16

## 2023-02-13 DIAGNOSIS — K047 Periapical abscess without sinus: Secondary | ICD-10-CM

## 2023-02-13 MED ORDER — ACETAMINOPHEN-CODEINE 300-30 MG PO TABS: 1.0000 | ORAL_TABLET | Freq: Four times a day (QID) | ORAL | 0 refills | Status: DC | PRN

## 2023-02-13 MED ORDER — AMOXICILLIN-POT CLAVULANATE 875-125 MG PO TABS: 1.0000 | ORAL_TABLET | Freq: Two times a day (BID) | ORAL | 0 refills | Status: DC

## 2023-02-13 MED ORDER — NYSTATIN 100000 UNIT/ML MT SUSP: 5.0000 mL | Freq: Four times a day (QID) | OROMUCOSAL | 0 refills | Status: DC | PRN

## 2023-02-13 NOTE — ED Triage Notes (Signed)
Patient presents to [facility] for dental problem. States swelling to right lower gum, sore x 2 weeks. Taking ibuprofen for pain.

## 2023-02-13 NOTE — ED Provider Notes (Signed)
Margaret [name]    CSN: [id number] Arrival date & time: 02/13/23  1150      History   Chief Complaint Chief Complaint  Patient presents with   Dental Problem    Swollen gum,puss bubble sore - Entered by patient    HPI Margaret [name] is a 37 y.o. female.   Patient presents for evaluation of right lower dental pain and swelling present for 2 weeks.  Noticed a bubble to the site within the last 2 weeks that popped and drained pus, resolved and has returned over the last 3 to 4 days.  Making it difficult to chew but able to tolerate some food and liquids.  Has attempted use of ibuprofen which helps to minimize pain but does not resolve.  Denies presence of fever.  Is not currently established with dentist.  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 Tiburcio Pea   Vaginal Pap smear, abnormal     Patient Active Problem List   Diagnosis Date Noted   BV (bacterial vaginosis) 12/26/2020   S/P LEEP 12/23/2019   CIN III (cervical intraepithelial neoplasia grade III) with severe dysplasia 12/07/2019   Normal labor and delivery 07/22/2018   Twin pregnancy with single intrauterine death, second trimester, fetus 2 02/12/2018   Short interval between pregnancies complicating pregnancy, antepartum 12/03/2017   Supervision of high risk pregnancy, antepartum 08/13/2016   Abnormal Pap smear of cervix 08/13/2016   Lump or mass in breast 11/12/2013    Past Surgical History:  Procedure Laterality Date   LEEP N/A 12/17/2019   Procedure: LOOP ELECTROSURGICAL EXCISION PROCEDURE (LEEP);  Surgeon: Nadara Mustard, MD;  Location: [facility] ORS;  Service: Gynecology;  Laterality: N/A;   TOOTH EXTRACTION      OB History     Gravida  4   Para  4   Term  4   Preterm      AB      Living  4      SAB      IAB      Ectopic      Multiple  0   Live Births  4         Obstetric Comments  1st Menstrual Cycle:  15 1st Pregnancy:  30  First labor was induced for postdates Second labor augmented after PROM          Home Medications    Prior to Admission medications   Medication Sig Start Date End Date Taking? Authorizing Provider  acetaminophen-codeine (TYLENOL #3) 300-30 MG tablet Take 1-2 tablets by mouth every 6 (six) hours as needed for moderate pain. 02/13/23  Yes Hue Frick, Elita Boone, NP  amoxicillin-clavulanate (AUGMENTIN) 875-125 MG tablet Take 1 tablet by mouth every 12 (twelve) hours. 02/13/23  Yes Krishiv Sandler, Elita Boone, NP  magic mouthwash (nystatin, lidocaine, diphenhydrAMINE, alum & mag hydroxide) suspension Swish and swallow 5 mLs 4 (four) times daily as needed for mouth pain. 02/13/23  Yes Coner Gibbard R, NP  etonogestrel (NEXPLANON) 68 MG IMPL implant 1 each by Subdermal route once. 01/25/22 01/25/25  [provider]  LORazepam (ATIVAN) 1 MG tablet Take 1 mg by mouth daily as needed. Patient not taking: Reported on 11/26/2022 12/05/20   [provider]    Family History Family History  Problem Relation Age of Onset   Healthy Mother  Social History Social History   Tobacco Use   Smoking status: Former    Current packs/day: 0.00    Average packs/day: 1 pack/day for 7.0 years (7.0 ttl pk-yrs)    Types: Cigarettes    Start date: 04/15/2007    Quit date: 04/14/2014    Years since quitting: 8.8   Smokeless tobacco: Never  Vaping Use   Vaping status: Every Day   Substances: Nicotine  Substance Use Topics   Alcohol use: No   Drug use: No     Allergies   Patient has no known allergies.   Review of Systems Review of Systems   Physical Exam Triage Vital Signs ED Triage Vitals [02/13/23 1205]  Encounter Vitals Group     BP 121/76     Systolic BP Percentile      Diastolic BP Percentile      Pulse Rate 87     Resp 16     Temp 97.8 F (36.6 C)     Temp Source Temporal     SpO2 98 %     Weight       Height      Head Circumference      Peak Flow      Pain Score 0     Pain Loc      Pain Education      Exclude from Growth Chart    No data found.  Updated Vital Signs BP 121/76 (BP Location: Left Arm)   Pulse 87   Temp 97.8 F (36.6 C) (Temporal)   Resp 16   SpO2 98%   Visual Acuity Right Eye Distance:   Left Eye Distance:   Bilateral Distance:    Right Eye Near:   Left Eye Near:    Bilateral Near:     Physical Exam Constitutional:      Appearance: Normal appearance.  HENT:     Mouth/Throat:     Comments: Dental decay present to the right lower gumline with mild to moderate swelling, dental abscess noted to tooth along the right lower gumline, pharynx clear without obstruction Neurological:     Mental Status: She is alert.      UC Treatments / Results  Labs (all labs ordered are listed, but only abnormal results are displayed) Labs Reviewed - No data to display  EKG   Radiology No results found.  Procedures Procedures (including critical care time)  Medications Ordered in UC Medications - No data to display  Initial Impression / Assessment and Plan / UC Course  I have reviewed the triage vital signs and the nursing notes.  Pertinent labs & imaging results that were available during my care of the patient were reviewed by me and considered in my medical decision making (see chart for details).  dental abscess  Abscess noted on exam with dental decay, consistent with infection, discussed with patient, prescribed Augmentin for treatment, additionally prescribed Magic mouthwash and Tylenol 3 for management of pain, PDMP reviewed, low risk, recommended additional supportive care and given walking referral to dentist for further evaluation and management Final Clinical Impressions(s) / UC Diagnoses   Final diagnoses:  Dental abscess     Discharge Instructions      Today you are evaluated for your dental pain  Begin Augmentin every morning and  every evening for 7 days to clear any infection contributing to your symptoms  May gargle and spit Magic mouthwash solution every 4-6 hours to provide temporary relief to the mouth  May  continue use of ibuprofen as needed for pain, for severe pain you may take Tylenol 3 every 6 hours, please be mindful of this may make you feel drowsy  May attempt use of salt water, throat lozenges warm liquids and soft foods for additional support  Please schedule follow-up appointment with dentist for further evaluation and management   ED Prescriptions     Medication Sig Dispense Auth. Provider   amoxicillin-clavulanate (AUGMENTIN) 875-125 MG tablet Take 1 tablet by mouth every 12 (twelve) hours. 14 tablet Salli Quarry R, NP   magic mouthwash (nystatin, lidocaine, diphenhydrAMINE, alum & mag hydroxide) suspension Swish and swallow 5 mLs 4 (four) times daily as needed for mouth pain. 180 mL Jaymien Landin R, NP   acetaminophen-codeine (TYLENOL #3) 300-30 MG tablet Take 1-2 tablets by mouth every 6 (six) hours as needed for moderate pain. 15 tablet Raymir Frommelt, Elita Boone, NP      I have reviewed the PDMP during this encounter.   Valinda Hoar, NP 02/13/23 1227

## 2023-02-13 NOTE — Discharge Instructions (Addendum)
Today you are evaluated for your dental pain  Begin Augmentin every morning and every evening for 7 days to clear any infection contributing to your symptoms  May gargle and spit Magic mouthwash solution every 4-6 hours to provide temporary relief to the mouth  May continue use of ibuprofen as needed for pain, for severe pain you may take Tylenol 3 every 6 hours, please be mindful of this may make you feel drowsy  May attempt use of salt water, throat lozenges warm liquids and soft foods for additional support  Please schedule follow-up appointment with dentist for further evaluation and management

## 2023-03-04 NOTE — Progress Notes (Unsigned)
PCP:  Alease Medina, MD   No chief complaint on file.    HPI:      [name] is a 37 y.o. [phone] whose LMP was No LMP recorded. Patient has had an implant., presents today for her annual examination.  Her menses are {norm/abn:715}, lasting {number: 22536} days.  Dysmenorrhea {dysmen:716}. She {does:18564} have intermenstrual bleeding.  Sex activity: {sex active: 315163}. Nepxlanon Replaced 01/25/22 Last Pap: 10/03/21 Results were: atypical squamous cellularity of undetermined significance (ASCUS) /neg HPV DNA  12/26/20 Results were NILM/neg HPV DNA. Hx of CIN 3 with LEEP 8/21; repeat due in 1 yr.  Hx of STDs: HPV  There is no FH of breast cancer. There is no FH of ovarian cancer. The patient {does:18564} do self-breast exams.  Tobacco use: {tob:20664} Alcohol use: {Alcohol:11675} No drug use.  Exercise: {exercise:31265}  She {does:18564} get adequate calcium and Vitamin D in her diet.  Patient Active Problem List   Diagnosis Date Noted   BV (bacterial vaginosis) 12/26/2020   S/P LEEP 12/23/2019   CIN III (cervical intraepithelial neoplasia grade III) with severe dysplasia 12/07/2019   Normal labor and delivery 07/22/2018   Twin pregnancy with single intrauterine death, second trimester, fetus 2 02/12/2018   Short interval between pregnancies complicating pregnancy, antepartum 12/03/2017   Supervision of high risk pregnancy, antepartum 08/13/2016   Abnormal Pap smear of cervix 08/13/2016   Lump or mass in breast 11/12/2013    Past Surgical History:  Procedure Laterality Date   LEEP N/A 12/17/2019   Procedure: LOOP ELECTROSURGICAL EXCISION PROCEDURE (LEEP);  Surgeon: Nadara Mustard, MD;  Location: [facility] ORS;  Service: Gynecology;  Laterality: N/A;   TOOTH EXTRACTION      Family History  Problem Relation Age of Onset   Healthy Mother     Social History   Socioeconomic History   Marital status: Married    Spouse name: Not on file   Number of  children: Not on file   Years of education: Not on file   Highest education level: Not on file  Occupational History   Not on file  Tobacco Use   Smoking status: Former    Current packs/day: 0.00    Average packs/day: 1 pack/day for 7.0 years (7.0 ttl pk-yrs)    Types: Cigarettes    Start date: 04/15/2007    Quit date: 04/14/2014    Years since quitting: 8.8   Smokeless tobacco: Never  Vaping Use   Vaping status: Every Day   Substances: Nicotine  Substance and Sexual Activity   Alcohol use: No   Drug use: No   Sexual activity: Yes    Birth control/protection: Implant  Other Topics Concern   Not on file  Social History Narrative   Not on file   Social Determinants of Health   Financial Resource Strain: Not on file  Food Insecurity: Not on file  Transportation Needs: Not on file  Physical Activity: Not on file  Stress: Not on file  Social Connections: Not on file  Intimate Partner Violence: Not on file     Current Outpatient Medications:    acetaminophen-codeine (TYLENOL #3) 300-30 MG tablet, Take 1-2 tablets by mouth every 6 (six) hours as needed for moderate pain., Disp: 15 tablet, Rfl: 0   amoxicillin-clavulanate (AUGMENTIN) 875-125 MG tablet, Take 1 tablet by mouth every 12 (twelve) hours., Disp: 14 tablet, Rfl: 0   etonogestrel (NEXPLANON) 68 MG IMPL implant, 1 each by Subdermal route once., Disp: ,  Rfl:    LORazepam (ATIVAN) 1 MG tablet, Take 1 mg by mouth daily as needed. (Patient not taking: Reported on 11/26/2022), Disp: , Rfl:    magic mouthwash (nystatin, lidocaine, diphenhydrAMINE, alum & mag hydroxide) suspension, Swish and swallow 5 mLs 4 (four) times daily as needed for mouth pain., Disp: 180 mL, Rfl: 0     ROS:  Review of Systems BREAST: No symptoms   Objective: There were no vitals taken for this visit.   OBGyn Exam  Results: No results found for this or any previous visit (from the past 24 hour(s)).  Assessment/Plan: No diagnosis  found.  No orders of the defined types were placed in this encounter.            GYN counsel {counseling: 16159}     F/U  No follow-ups on file.  Neta Upadhyay B. Elisabet Gutzmer, PA-C 03/04/2023 5:15 PM

## 2023-03-05 ENCOUNTER — Ambulatory Visit (INDEPENDENT_AMBULATORY_CARE_PROVIDER_SITE_OTHER): Payer: Medicaid Other | Admitting: Obstetrics and Gynecology

## 2023-03-05 ENCOUNTER — Encounter: Payer: Self-pay | Admitting: Obstetrics and Gynecology

## 2023-03-05 ENCOUNTER — Other Ambulatory Visit (HOSPITAL_COMMUNITY)
Admission: RE | Admit: 2023-03-05 | Discharge: 2023-03-05 | Disposition: A | Discharge status: Home / Self Care | Payer: Medicaid Other | Source: Ambulatory Visit | Attending: Obstetrics and Gynecology | Admitting: Obstetrics and Gynecology

## 2023-03-05 VITALS — BP 104/60 | Ht 67.0 in | Wt 157.0 lb

## 2023-03-05 DIAGNOSIS — D069 Carcinoma in situ of cervix, unspecified: Secondary | ICD-10-CM | POA: Insufficient documentation

## 2023-03-05 DIAGNOSIS — Z1151 Encounter for screening for human papillomavirus (HPV): Secondary | ICD-10-CM | POA: Insufficient documentation

## 2023-03-05 DIAGNOSIS — Z01419 Encounter for gynecological examination (general) (routine) without abnormal findings: Secondary | ICD-10-CM | POA: Diagnosis not present

## 2023-03-05 DIAGNOSIS — Z124 Encounter for screening for malignant neoplasm of cervix: Secondary | ICD-10-CM | POA: Insufficient documentation

## 2023-03-05 DIAGNOSIS — Z3046 Encounter for surveillance of implantable subdermal contraceptive: Secondary | ICD-10-CM

## 2023-03-05 NOTE — Patient Instructions (Signed)
I value your feedback and you entrusting us with your care. If you get a [facility] patient survey, I would appreciate you taking the time to let us know about your experience today. Thank you! ? ? ?

## 2023-03-11 LAB — CYTOLOGY - PAP
Comment: NEGATIVE
Diagnosis: NEGATIVE
High risk HPV: NEGATIVE

## 2023-05-23 ENCOUNTER — Encounter (INDEPENDENT_AMBULATORY_CARE_PROVIDER_SITE_OTHER): Payer: Self-pay

## 2023-12-22 ENCOUNTER — Other Ambulatory Visit: Payer: Self-pay | Admitting: Medical Genetics

## 2023-12-25 ENCOUNTER — Other Ambulatory Visit

## 2024-01-21 ENCOUNTER — Ambulatory Visit

## 2024-02-28 ENCOUNTER — Other Ambulatory Visit: Payer: Self-pay | Admitting: Medical Genetics

## 2024-02-28 DIAGNOSIS — Z006 Encounter for examination for normal comparison and control in clinical research program: Secondary | ICD-10-CM

## 2024-03-03 ENCOUNTER — Encounter: Payer: Self-pay | Admitting: Family Medicine

## 2024-03-03 ENCOUNTER — Ambulatory Visit: Admitting: Family Medicine

## 2024-03-03 VITALS — BP 106/76 | HR 85 | Temp 99.3°F | Ht 67.0 in | Wt 160.6 lb

## 2024-03-03 DIAGNOSIS — F411 Generalized anxiety disorder: Secondary | ICD-10-CM | POA: Diagnosis not present

## 2024-03-03 DIAGNOSIS — Z789 Other specified health status: Secondary | ICD-10-CM

## 2024-03-03 DIAGNOSIS — Z1159 Encounter for screening for other viral diseases: Secondary | ICD-10-CM

## 2024-03-03 DIAGNOSIS — R7989 Other specified abnormal findings of blood chemistry: Secondary | ICD-10-CM | POA: Diagnosis not present

## 2024-03-03 DIAGNOSIS — R7309 Other abnormal glucose: Secondary | ICD-10-CM

## 2024-03-03 DIAGNOSIS — Z23 Encounter for immunization: Secondary | ICD-10-CM

## 2024-03-03 DIAGNOSIS — Z79899 Other long term (current) drug therapy: Secondary | ICD-10-CM | POA: Diagnosis not present

## 2024-03-03 DIAGNOSIS — Z136 Encounter for screening for cardiovascular disorders: Secondary | ICD-10-CM

## 2024-03-03 DIAGNOSIS — Z7689 Persons encountering health services in other specified circumstances: Secondary | ICD-10-CM

## 2024-03-03 DIAGNOSIS — F410 Panic disorder [episodic paroxysmal anxiety] without agoraphobia: Secondary | ICD-10-CM

## 2024-03-03 DIAGNOSIS — Z130 Encounter for screening for diseases of the blood and blood-forming organs and certain disorders involving the immune mechanism: Secondary | ICD-10-CM

## 2024-03-03 MED ORDER — ESCITALOPRAM OXALATE 10 MG PO TABS: 10.0000 mg | ORAL_TABLET | Freq: Every day | ORAL | 0 refills | Status: DC

## 2024-03-03 MED ORDER — CLONAZEPAM 0.5 MG PO TABS: 0.5000 mg | ORAL_TABLET | Freq: Two times a day (BID) | ORAL | 0 refills | Status: AC | PRN

## 2024-03-03 NOTE — Patient Instructions (Addendum)
 [employer] testing - Depending on insurance, it can have a copay of up to a maximum of $330 (even if insurance denies it). Medicaid appears to have a copay of $0, but if you want a more definite quote of the cost before committing to it, you can contact the company by email at support@[employer] .com or call [phone].   [email or web address]

## 2024-03-03 NOTE — Progress Notes (Signed)
 New patient visit   Patient: Margaret [name]   DOB: 06/03/1985   38 y.o. Female  MRN: [id number] Visit Date: 03/03/2024  Today's healthcare provider: LAURAINE LOISE BUOY, DO   Chief Complaint  Patient presents with   New Patient (Initial Visit)    Patient is here today to establish care with a primary provider.  Her concerns today is getting up to date on vaccinations and anxiety.  Flu vaccine-yes HPV- vaccine-yes  Hepatitis B Vaccine- test for antibody  Hepatitis C Screening-yes   Subjective    Margaret [name] is a 38 y.o. female who presents today as a new patient to establish care.  HPI HPI     New Patient (Initial Visit)    Additional comments: Patient is here today to establish care with a primary provider.  Her concerns today is getting up to date on vaccinations and anxiety.  Flu vaccine-yes HPV- vaccine-yes  Hepatitis B Vaccine- test for antibody  Hepatitis C Screening-yes      Last edited by Terrel Powell CROME, CMA on 03/03/2024  3:18 PM.       Margaret [name] is a 38 year old female who presents to establish care and address her anxiety.  She has a history of anxiety that has significantly worsened over the past two years, coinciding with stressful events involving her children. She experiences extreme anxiety daily, particularly at night and when driving home, with symptoms including heart palpitations, dry mouth, difficulty swallowing, and panic attacks. She avoids highways due to anxiety and has a history of anxiety since childhood. She has tried various medications including propranolol, fluoxetine, buspirone, gabapentin, and trazodone, but none have been effective. Ativan was the only medication that provided relief in the past. She has not engaged in therapy recently and feels overwhelmed by her responsibilities and anxiety.  She has a history of a high-grade lesion of the cervix, for which she underwent a biopsy. She believes her last Pap smear  was within the past year and recalls being told to follow up in July 2026, but is unsure of the exact timing and result. She has an Explanon implant for contraception, which is valid through next year. She has not established care with a new OB provider since her previous one.  She recalls having blood work done within the past year, which indicated an abnormal hemoglobin A1c, but she did not receive a follow-up call regarding the results. She is open to having blood work done again.  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 She reports significant stress related to her children's behavioral issues and her workload. She has recently stopped eating fast food to manage her weight. No history of vitamin deficiencies or anemia outside of pregnancy. She experiences panic attacks at least three times a week, often requiring her to pull over while driving to calm down. She stays in contact with her aunt, [name], who also has anxiety and helps her through these episodes.     Past Medical History:  Diagnosis Date   Anxiety    High grade squamous intraepithelial lesion (HGSIL), grade 3 CIN, on biopsy of cervix 11/2019   Dr. Arloa   Vaginal Pap smear, abnormal    Past Surgical History:  Procedure Laterality Date   LEEP N/A 12/17/2019   Procedure: LOOP ELECTROSURGICAL EXCISION PROCEDURE (LEEP);  Surgeon: Arloa Lamar SQUIBB, MD;  Location: [facility] ORS;  Service: Gynecology;  Laterality: N/A;   TOOTH EXTRACTION     Family Status  Relation Name Status   Mother  Alive   Father  Alive   Sister  (Not Specified)   Son  (Not Specified)   Mat Aunt  (Not Specified)  No partnership data on file   Family History  Problem Relation Age of Onset   Healthy Mother    COPD Father    Anxiety disorder Sister    Anxiety disorder Son    Anxiety disorder Maternal Aunt    Social History   Socioeconomic History   Marital status: Married     Spouse name: Not on file   Number of children: Not on file   Years of education: Not on file   Highest education level: Not on file  Occupational History   Not on file  Tobacco Use   Smoking status: Former    Current packs/day: 0.00    Average packs/day: 1 pack/day for 7.0 years (7.0 ttl pk-yrs)    Types: Cigarettes    Start date: 04/15/2007    Quit date: 04/14/2014    Years since quitting: 9.9   Smokeless tobacco: Never  Vaping Use   Vaping status: Every Day   Substances: Nicotine  Substance and Sexual Activity   Alcohol use: No   Drug use: No   Sexual activity: Yes    Birth control/protection: Implant  Other Topics Concern   Not on file  Social History Narrative   Not on file   Social Drivers of Health   Financial Resource Strain: Low Risk  (03/03/2024)   Overall Financial Resource Strain (CARDIA)    Difficulty of Paying Living Expenses: Not hard at all  Food Insecurity: No Food Insecurity (03/03/2024)   Hunger Vital Sign    Worried About Running Out of Food in the Last Year: Never true    Ran Out of Food in the Last Year: Never true  Transportation Needs: No Transportation Needs (03/03/2024)   PRAPARE - Administrator, Civil Service (Medical): No    Lack of Transportation (Non-Medical): No  Physical Activity: Not on file  Stress: No Stress Concern Present (03/03/2024)   [employer] of Occupational Health - Occupational Stress Questionnaire    Feeling of Stress: Not at all  Social Connections: Not on file   Outpatient Medications Prior to Visit  Medication Sig   etonogestrel  (NEXPLANON ) 68 MG IMPL implant 1 each by Subdermal route once.   [DISCONTINUED] busPIRone (BUSPAR) 15 MG tablet Take 7.5-15 mg by mouth 2 (two) times daily.   [DISCONTINUED] FLUoxetine (PROZAC) 20 MG capsule Take 20 mg by mouth at bedtime.   [DISCONTINUED] propranolol (INDERAL) 10 MG tablet Take 10 mg by mouth every 8 (eight) hours as needed. (Patient not taking: Reported on  03/05/2023)   No facility-administered medications prior to visit.   No Known Allergies  Immunization History  Administered Date(s) Administered   HPV 9-valent 03/03/2024   Influenza, Seasonal, Injecte, Preservative Fre 03/03/2024   Influenza,inj,Quad PF,6+ Mos 01/28/2018, 04/09/2023   Tdap 12/26/2016, 05/19/2018    Health Maintenance  Topic Date Due   HPV VACCINES (2 - Risk 3-dose series) 03/31/2024   Hepatitis B Vaccines 19-59 Average Risk (1 of 3 - 19+ 3-dose series) 03/17/2024 (Originally 03/13/2007)   COVID-19 Vaccine (1) 01/12/2025 (Originally 03/12/1993)   Cervical Cancer Screening (HPV/Pap Cotest)  03/04/2028   DTaP/Tdap/Td (3 - Td or Tdap) 05/19/2028   Influenza Vaccine  Completed   Hepatitis C Screening  Completed   HIV Screening  Completed   Pneumococcal Vaccine  Aged Out   Meningococcal B Vaccine  Aged Out    Patient Care Team: Jessalynn Mccowan, Lauraine SAILOR, DO as PCP - General (Family Medicine) Dessa, Reyes ORN, MD (General Surgery) Leonce Garnette BIRCH, MD as Attending Physician (Obstetrics and Gynecology)       Objective    BP 106/76 (BP Location: Left Arm, Patient Position: Sitting)   Pulse 85   Temp 99.3 F (37.4 C) (Oral)   Ht 5' 7 (1.702 m)   Wt 160 lb 9.6 oz (72.8 kg)   SpO2 99%   BMI 25.15 kg/m     Physical Exam Vitals and nursing note reviewed.  Constitutional:      General: She is not in acute distress.    Appearance: Normal appearance.  HENT:     Head: Normocephalic and atraumatic.  Eyes:     General: No scleral icterus.    Conjunctiva/sclera: Conjunctivae normal.  Cardiovascular:     Rate and Rhythm: Normal rate.  Pulmonary:     Effort: Pulmonary effort is normal.  Neurological:     Mental Status: She is alert and oriented to person, place, and time. Mental status is at baseline.  Psychiatric:        Mood and Affect: Mood normal.        Behavior: Behavior normal.     Depression Screen    03/03/2024    3:12 PM  PHQ 2/9 Scores   PHQ - 2 Score 0  PHQ- 9 Score 1   Results for orders placed or performed in visit on 03/03/24  Comprehensive metabolic panel with GFR  Result Value Ref Range   Glucose 75 70 - 99 mg/dL   BUN 15 6 - 20 mg/dL   Creatinine, Ser 9.29 0.57 - 1.00 mg/dL   eGFR 883 >40 fO/fpw/8.26   BUN/Creatinine Ratio 21 9 - 23   Sodium 139 134 - 144 mmol/L   Potassium 4.2 3.5 - 5.2 mmol/L   Chloride 103 96 - 106 mmol/L   CO2 22 20 - 29 mmol/L   Calcium  10.0 8.7 - 10.2 mg/dL   Total Protein 7.5 6.0 - 8.5 g/dL   Albumin 5.0 (H) 3.9 - 4.9 g/dL   Globulin, Total 2.5 1.5 - 4.5 g/dL   Bilirubin Total 0.6 0.0 - 1.2 mg/dL   Alkaline Phosphatase 54 41 - 116 IU/L   AST 13 0 - 40 IU/L   ALT 6 0 - 32 IU/L  Hemoglobin A1c  Result Value Ref Range   Hgb A1c MFr Bld 4.9 4.8 - 5.6 %   Est. average glucose Bld gHb Est-mCnc 94 mg/dL  Lipid panel  Result Value Ref Range   Cholesterol, Total 185 100 - 199 mg/dL   Triglycerides 73 0 - 149 mg/dL   HDL 53 >60 mg/dL   VLDL Cholesterol Cal 14 5 - 40 mg/dL   LDL Chol Calc (NIH) 881 (H) 0 - 99 mg/dL   Chol/HDL Ratio 3.5 0.0 - 4.4 ratio  TSH Rfx on Abnormal to Free T4  Result Value Ref Range   TSH 0.929 0.450 - 4.500 uIU/mL  HCV Ab w Reflex to Quant PCR  Result Value Ref Range   HCV Ab Non Reactive Non Reactive  Hepatitis B Surface AntiBODY  Result Value Ref Range   Hep B Surface Ab, Qual Reactive   Drug Screen 13 with reflex Confirmation (AMP,BAR,BZO,COC,PCP,THC,OPI,OXY,MD,FEN,MEP,PPX,TRAM), Serum  Result Value Ref Range   Amphetamines, IA  Negative Cutoff:50 ng/mL   Barbiturates, IA Negative Cutoff:0.1 ug/mL   Benzodiazepines, IA Negative Cutoff:20 ng/mL   Cocaine & Metabolite, IA Negative Cutoff:25 ng/mL   Phencyclidine, IA Negative Cutoff:8 ng/mL   THC(Marijuana) Metabolite, IA Negative Cutoff:5 ng/mL   Opiates, IA Negative Cutoff:5 ng/mL   Oxycodones, IA Negative Cutoff:5 ng/mL   Methadone, IA Negative Cutoff:25 ng/mL   FENTANYL , IA Negative Cutoff:1.0  ng/mL   Propoxyphene, IA Negative Cutoff:50 ng/mL   MEPERIDINE, IA Negative Cutoff:100 ng/mL   TRAMADOL, IA Negative Cutoff:50 ng/mL  Interpretation:  Result Value Ref Range   HCV Interp 1: Comment     Assessment & Plan     Generalized anxiety disorder with panic attacks -     Escitalopram Oxalate; Take 1 tablet (10 mg total) by mouth daily.  Dispense: 90 tablet; Refill: 0 -     Drug Screen 13 with reflex Confirmation (AMP,BAR,BZO,COC,PCP,THC,OPI,OXY,MD,FEN,MEP,PPX,TRAM), Serum -     clonazePAM; Take 1 tablet (0.5 mg total) by mouth 2 (two) times daily as needed for anxiety.  Dispense: 30 tablet; Refill: 0  Establishing care with new doctor, encounter for  Elevated hemoglobin A1c -     Hemoglobin A1c  Need for influenza vaccination -     Flu vaccine trivalent PF, 6mos and older(Flulaval,Afluria,Fluarix,Fluzone)  Need for HPV vaccination -     HPV 9-valent vaccine,Recombinat  Hepatitis B vaccination status unknown -     Hepatitis B surface antibody,qualitative  Encounter for hepatitis C screening test for low risk patient -     HCV Ab w Reflex to Quant PCR -     Interpretation:  Encounter for screening for cardiovascular disorders -     Lipid panel  Screening for endocrine, metabolic and immunity disorder -     Comprehensive metabolic panel with GFR  Low thyroid  stimulating hormone (TSH) level -     TSH Rfx on Abnormal to Free T4  High risk medication use -     Drug Screen 13 with reflex Confirmation (AMP,BAR,BZO,COC,PCP,THC,OPI,OXY,MD,FEN,MEP,PPX,TRAM), Serum      Generalized anxiety disorder with panic attacks Chronic anxiety with panic attacks exacerbated by family stressors. Previous medications ineffective. Ativan was helpful. Seeking effective medication management. - Prescribed escitalopram (Lexapro) for anxiety. - Consider short-term clonazepam (Klonopin) for acute panic attacks, emphasizing immediate relief use.  Order urine drug screen today. - Discussed  genetic testing for medication metabolism; verify insurance coverage for copay.  Patient to advise clinic if she would like to have GeneSight testing done. - Schedule follow-up in 4-6 weeks to assess medication response, with virtual visit option.  High-grade cervical lesion, status post LEEP Treated with LEEP. Last Pap smear normal. Due for follow-up Pap smear. - Schedule follow-up Pap smear with gynecology.  Defer to specialist management.  Immunizations Due for flu and HPV vaccinations. No prior HPV vaccination. Declined COVID booster. - Administer flu vaccine. - Administer HPV vaccine.  Hepatitis B vaccination status unknown - Check hepatitis B surface antibody today.    Return in about 5 weeks (around 04/07/2024) for Anx/Dep.     I discussed the assessment and treatment plan with the patient  The patient was provided an opportunity to ask questions and all were answered. The patient agreed with the plan and demonstrated an understanding of the instructions.   The patient was advised to call back or seek an in-person evaluation if the symptoms worsen or if the condition fails to improve as anticipated.    LAURAINE LOISE BUOY, DO  [facility]  [facility] [phone] (phone) [phone] (fax)  [facility]

## 2024-03-07 LAB — COMPREHENSIVE METABOLIC PANEL WITH GFR
ALT: 6 IU/L (ref 0–32)
AST: 13 IU/L (ref 0–40)
Albumin: 5 g/dL — ABNORMAL HIGH (ref 3.9–4.9)
Alkaline Phosphatase: 54 IU/L (ref 41–116)
BUN/Creatinine Ratio: 21 (ref 9–23)
BUN: 15 mg/dL (ref 6–20)
Bilirubin Total: 0.6 mg/dL (ref 0.0–1.2)
CO2: 22 mmol/L (ref 20–29)
Calcium: 10 mg/dL (ref 8.7–10.2)
Chloride: 103 mmol/L (ref 96–106)
Creatinine, Ser: 0.7 mg/dL (ref 0.57–1.00)
Globulin, Total: 2.5 g/dL (ref 1.5–4.5)
Glucose: 75 mg/dL (ref 70–99)
Potassium: 4.2 mmol/L (ref 3.5–5.2)
Sodium: 139 mmol/L (ref 134–144)
Total Protein: 7.5 g/dL (ref 6.0–8.5)
eGFR: 116 mL/min/1.73 (ref >59)

## 2024-03-07 LAB — HEPATITIS B SURFACE ANTIBODY,QUALITATIVE: Hep B Surface Ab, Qual: REACTIVE

## 2024-03-07 LAB — LIPID PANEL
Chol/HDL Ratio: 3.5 ratio (ref 0.0–4.4)
Cholesterol, Total: 185 mg/dL (ref 100–199)
HDL: 53 mg/dL (ref >39)
LDL Chol Calc (NIH): 118 mg/dL — ABNORMAL HIGH (ref 0–99)
Triglycerides: 73 mg/dL (ref 0–149)
VLDL Cholesterol Cal: 14 mg/dL (ref 5–40)

## 2024-03-07 LAB — HEMOGLOBIN A1C
Est. average glucose Bld gHb Est-mCnc: 94 mg/dL
Hgb A1c MFr Bld: 4.9 % (ref 4.8–5.6)

## 2024-03-07 LAB — HCV AB W REFLEX TO QUANT PCR: HCV Ab: NONREACTIVE

## 2024-03-07 LAB — DRUG SCREEN 13 W/CONF , SERUM
Amphetamines, IA: NEGATIVE ng/mL
Barbiturates, IA: NEGATIVE ug/mL
Benzodiazepines, IA: NEGATIVE ng/mL
Cocaine & Metabolite, IA: NEGATIVE ng/mL
FENTANYL, IA: NEGATIVE ng/mL
MEPERIDINE, IA: NEGATIVE ng/mL
Methadone, IA: NEGATIVE ng/mL
Opiates, IA: NEGATIVE ng/mL
Oxycodones, IA: NEGATIVE ng/mL
Phencyclidine, IA: NEGATIVE ng/mL
Propoxyphene, IA: NEGATIVE ng/mL
THC(Marijuana) Metabolite, IA: NEGATIVE ng/mL
TRAMADOL, IA: NEGATIVE ng/mL

## 2024-03-07 LAB — HCV INTERPRETATION

## 2024-03-07 LAB — TSH RFX ON ABNORMAL TO FREE T4: TSH: 0.929 u[IU]/mL (ref 0.450–4.500)

## 2024-03-10 ENCOUNTER — Ambulatory Visit: Payer: Self-pay | Admitting: Family Medicine

## 2024-03-27 ENCOUNTER — Ambulatory Visit: Payer: Self-pay

## 2024-05-01 ENCOUNTER — Ambulatory Visit
Admission: RE | Admit: 2024-05-01 | Discharge: 2024-05-01 | Disposition: A | Discharge status: Home / Self Care | Payer: Self-pay | Source: Ambulatory Visit | Attending: Emergency Medicine | Admitting: Emergency Medicine

## 2024-05-01 VITALS — BP 109/73 | HR 79 | Temp 98.2°F | Resp 18

## 2024-05-01 DIAGNOSIS — B9689 Other specified bacterial agents as the cause of diseases classified elsewhere: Secondary | ICD-10-CM | POA: Insufficient documentation

## 2024-05-01 DIAGNOSIS — B001 Herpesviral vesicular dermatitis: Secondary | ICD-10-CM | POA: Insufficient documentation

## 2024-05-01 DIAGNOSIS — N898 Other specified noninflammatory disorders of vagina: Secondary | ICD-10-CM

## 2024-05-01 DIAGNOSIS — N760 Acute vaginitis: Secondary | ICD-10-CM | POA: Diagnosis not present

## 2024-05-01 MED ORDER — ACYCLOVIR 400 MG PO TABS: 400.0000 mg | ORAL_TABLET | Freq: Two times a day (BID) | ORAL | 1 refills | Status: AC

## 2024-05-01 MED ORDER — METRONIDAZOLE 500 MG PO TABS: 500.0000 mg | ORAL_TABLET | Freq: Two times a day (BID) | ORAL | 0 refills | Status: AC

## 2024-05-01 NOTE — ED Triage Notes (Signed)
 Patient reports odor and cold sore on nose. Patient has used Valtrex for cold sore. Patient reports she out of Valtrex.

## 2024-05-01 NOTE — Discharge Instructions (Signed)
 Today you are being treated  for  Bacterial vaginosis   Take Metronidazole  500 mg twice a day for 7 days, do not drink alcohol while using medication, this will make you feel sick   Bacterial vaginosis which results from an overgrowth of one on several organisms that are normally present in your vagina. Vaginosis is an inflammation of the vagina that can result in discharge, itching and pain.  Labs pending 2-3 days, you will be contacted if positive for any sti and treatment will be sent to the pharmacy, you will have to return to the clinic if positive for gonorrhea to receive treatment   Please refrain from having sex until labs results, if positive please refrain from having sex until treatment complete and symptoms resolve    In addition: Avoid baths, hot tubs and whirlpool spas.  Don't use scented or harsh soaps Avoid irritants. These include scented tampons and pads. Wipe from front to back after using the toilet. Don't douche. Your vagina doesn't require cleansing other than normal bathing.  Use a condom.  Wear cotton underwear, this fabric absorbs some moisture.    Today your medicine for cold sore has been refilled and additional refill placed at the pharmacy  Take as directed

## 2024-05-01 NOTE — ED Provider Notes (Signed)
 " [facility]    CSN: [id number] Arrival date & time: 05/01/24  1909      History   Chief Complaint Chief Complaint  Patient presents with   Vaginal Discharge    Possible recurrent bv as well as recurrent cold sore on nose - Entered by patient   Mouth Lesions    On nose    HPI Margaret [name] is a 38 y.o. female.   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/07/2019   Normal labor and delivery 07/22/2018   Twin pregnancy with single intrauterine death, second trimester, fetus 2 02/12/2018   Short interval between pregnancies complicating pregnancy, antepartum 12/03/2017   Supervision of high risk pregnancy, antepartum 08/13/2016   Abnormal Pap smear of cervix 08/13/2016   Lump or mass in breast 11/12/2013    Past Surgical History:  Procedure Laterality Date   LEEP N/A 12/17/2019   Procedure: LOOP ELECTROSURGICAL EXCISION PROCEDURE (LEEP);  Surgeon: Arloa Lamar SQUIBB, MD;  Location: [facility] ORS;  Service: Gynecology;  Laterality: N/A;   TOOTH EXTRACTION      OB History     Gravida  4   Para  4   Term  4   Preterm      AB      Living  4      SAB      IAB      Ectopic      Multiple  0   Live Births  4        Obstetric Comments  1st  Menstrual Cycle:  15 1st Pregnancy:  41  First labor was induced for postdates Second labor augmented after PROM          Home Medications    Prior to Admission medications  Medication Sig Start Date End Date Taking? Authorizing Provider  acyclovir  (ZOVIRAX ) 400 MG tablet Take 400 mg by mouth 2 (two) times daily.    [provider]  clonazePAM  (KLONOPIN ) 0.5 MG tablet Take 1 tablet (0.5 mg total) by mouth 2 (two) times daily as needed for anxiety. 03/03/24   Donzella Lauraine SAILOR, DO  escitalopram  (LEXAPRO ) 10 MG tablet Take 1 tablet (10 mg total) by mouth daily. 03/03/24   Donzella Lauraine SAILOR, DO  etonogestrel  (NEXPLANON ) 68 MG IMPL implant 1 each by Subdermal route once. 01/25/22 01/25/25  [provider]    Family History Family History  Problem Relation Age of Onset   Healthy Mother    COPD Father    Anxiety disorder Sister    Anxiety disorder Son    Anxiety disorder Maternal Aunt     Social History Social History[1]   Allergies   Patient has no known allergies.   Review of Systems Review  of Systems  HENT:  Positive for mouth sores.   Genitourinary:  Positive for vaginal discharge.     Physical Exam Triage Vital Signs ED Triage Vitals  Encounter Vitals Group     BP 05/01/24 1933 109/73     Girls Systolic BP Percentile --      Girls Diastolic BP Percentile --      Boys Systolic BP Percentile --      Boys Diastolic BP Percentile --      Pulse Rate 05/01/24 1933 79     Resp 05/01/24 1933 18     Temp 05/01/24 1933 98.2 F (36.8 C)     Temp Source 05/01/24 1933 Oral     SpO2 05/01/24 1933 98 %     Weight --      Height --      Head Circumference --      Peak Flow --      Pain Score 05/01/24 1936 0     Pain Loc --      Pain Education --      Exclude from Growth Chart --    No data found.  Updated Vital Signs BP 109/73 (BP Location: Left Arm)   Pulse 79   Temp 98.2 F (36.8 C) (Oral)   Resp 18   LMP 04/26/2024 (Approximate)   SpO2 98%    Visual Acuity Right Eye Distance:   Left Eye Distance:   Bilateral Distance:    Right Eye Near:   Left Eye Near:    Bilateral Near:     Physical Exam   UC Treatments / Results  Labs (all labs ordered are listed, but only abnormal results are displayed) Labs Reviewed - No data to display  EKG   Radiology No results found.  Procedures Procedures (including critical care time)  Medications Ordered in UC Medications - No data to display  Initial Impression / Assessment and Plan / UC Course  I have reviewed the triage vital signs and the nursing notes.  Pertinent labs & imaging results that were available during my care of the patient were reviewed by me and considered in my medical decision making (see chart for details).     *** Final Clinical Impressions(s) / UC Diagnoses   Final diagnoses:  Cold sore  Vaginal discharge   Discharge Instructions   None    ED Prescriptions   None    PDMP not reviewed this encounter.    [1]  Social History Tobacco Use   Smoking status: Former    Current packs/day: 0.00    Average packs/day: 1 pack/day for 7.0 years (7.0 ttl pk-yrs)    Types: Cigarettes    Start date: 04/15/2007    Quit date: 04/14/2014    Years since quitting: 10.0   Smokeless tobacco: Never  Vaping Use   Vaping status: Every Day   Substances: Nicotine  Substance Use Topics   Alcohol use: No   Drug use: No   "

## 2024-05-04 ENCOUNTER — Ambulatory Visit (HOSPITAL_COMMUNITY): Payer: Self-pay

## 2024-05-04 LAB — CERVICOVAGINAL ANCILLARY ONLY
Bacterial Vaginitis (gardnerella): POSITIVE — AB
Candida Glabrata: NEGATIVE
Candida Vaginitis: NEGATIVE
Comment: NEGATIVE
Comment: NEGATIVE
Comment: NEGATIVE

## 2024-05-05 ENCOUNTER — Telehealth: Admitting: Family Medicine

## 2024-05-05 ENCOUNTER — Encounter: Payer: Self-pay | Admitting: Family Medicine

## 2024-05-05 DIAGNOSIS — F411 Generalized anxiety disorder: Secondary | ICD-10-CM

## 2024-05-05 NOTE — Progress Notes (Signed)
" ° ° °  MyChart Video Visit    Virtual Visit via Video Note   This format is felt to be most appropriate for this patient at this time. Physical exam was limited by quality of the video and audio technology used for the visit.   Patient location: [place]  Provider location: [facility]  I discussed the limitations of evaluation and management by telemedicine and the availability of in person appointments. The patient expressed understanding and agreed to proceed.  Patient: Margaret [name]   DOB: 06/18/1985   38 y.o. Female  MRN: [id number] Visit Date: 05/05/2024  Today's healthcare provider: LAURAINE LOISE BUOY, DO   No chief complaint on file.  Subjective    HPI  Margaret [name] is a 38 year old female who presents for follow-up on her anxiety and medication management.  She is currently on antiviral medication for a cold sore on her nose and antibiotics for bacterial vaginosis following a recent visit to urgent care.  She feels her anxiety is fairly well-controlled overall on escitalopram .  She takes Klonopin  once a week, splitting the pill, and does not take a whole lot of it.   Medications: Show/hide medication list[1]       Objective    LMP 04/26/2024 (Approximate)       Physical Exam Constitutional:      General: She is not in acute distress.    Appearance: Normal appearance.  HENT:     Head: Normocephalic.  Pulmonary:     Effort: Pulmonary effort is normal. No respiratory distress.  Neurological:     Mental Status: She is alert and oriented to person, place, and time. Mental status is at baseline.       Assessment & Plan    Generalized anxiety disorder   Well-managed with escitalopram . Occasional anxiety and fatigue. No significant issues with mood or self-harm. - Continue escitalopram  10 mg daily. - Follow-up in six months for re-evaluation.    Return in about 6 months (around 11/03/2024) for CPE, anx.     I discussed  the assessment and treatment plan with the patient. The patient was provided an opportunity to ask questions and all were answered. The patient agreed with the plan and demonstrated an understanding of the instructions.   The patient was advised to call back or seek an in-person evaluation if the symptoms worsen or if the condition fails to improve as anticipated.  I provided 6 minutes of virtual-face-to-face time during this encounter.   LAURAINE LOISE BUOY, DO [place] [facility] [phone] (phone) [phone] (fax)   Medical Group      [1]  Outpatient Medications Prior to Visit  Medication Sig   acyclovir  (ZOVIRAX ) 400 MG tablet Take 1 tablet (400 mg total) by mouth 2 (two) times daily for 20 days.   clonazePAM  (KLONOPIN ) 0.5 MG tablet Take 1 tablet (0.5 mg total) by mouth 2 (two) times daily as needed for anxiety.   escitalopram  (LEXAPRO ) 10 MG tablet Take 1 tablet (10 mg total) by mouth daily.   etonogestrel  (NEXPLANON ) 68 MG IMPL implant 1 each by Subdermal route once.   metroNIDAZOLE  (FLAGYL ) 500 MG tablet Take 1 tablet (500 mg total) by mouth 2 (two) times daily.   No facility-administered medications prior to visit.   "

## 2024-06-01 ENCOUNTER — Telehealth: Payer: Self-pay | Admitting: Family Medicine

## 2024-06-01 DIAGNOSIS — F410 Panic disorder [episodic paroxysmal anxiety] without agoraphobia: Secondary | ICD-10-CM

## 2024-06-01 MED ORDER — ESCITALOPRAM OXALATE 10 MG PO TABS: 10.0000 mg | ORAL_TABLET | Freq: Every day | ORAL | 1 refills | Status: AC

## 2024-06-01 NOTE — Telephone Encounter (Signed)
 [facility] faxed refill request for the following medications:  escitalopram  (LEXAPRO ) 10 MG tablet     Please advise.

## 2024-07-01 ENCOUNTER — Ambulatory Visit: Admitting: Certified Nurse Midwife
